# Patient Record
Sex: Male | Born: 1970 | Race: White | Hispanic: No | Marital: Single | Smoking: Never smoker
Health system: Southern US, Community
[De-identification: ages and names within clinical notes are randomized; demographics above are authoritative.]

## PROBLEM LIST (undated history)

## (undated) DIAGNOSIS — K629 Disease of anus and rectum, unspecified: Secondary | ICD-10-CM

## (undated) DIAGNOSIS — K589 Irritable bowel syndrome without diarrhea: Secondary | ICD-10-CM

## (undated) DIAGNOSIS — A630 Anogenital (venereal) warts: Secondary | ICD-10-CM

## (undated) DIAGNOSIS — F419 Anxiety disorder, unspecified: Secondary | ICD-10-CM

## (undated) DIAGNOSIS — K219 Gastro-esophageal reflux disease without esophagitis: Secondary | ICD-10-CM

## (undated) HISTORY — PX: ADENOID EXAMINATION UNDER ANESTHESIA: SHX1127

## (undated) HISTORY — PX: CHOLECYSTECTOMY: SHX55

## (undated) HISTORY — DX: Anxiety disorder, unspecified: F41.9

## (undated) HISTORY — DX: Disease of anus and rectum, unspecified: K62.9

## (undated) HISTORY — DX: Anogenital (venereal) warts: A63.0

## (undated) HISTORY — PX: COLONOSCOPY: SHX174

## (undated) HISTORY — DX: Gastro-esophageal reflux disease without esophagitis: K21.9

## (undated) HISTORY — DX: Irritable bowel syndrome, unspecified: K58.9

---

## 2010-07-08 ENCOUNTER — Ambulatory Visit: Payer: Self-pay | Admitting: Gastroenterology

## 2010-08-04 ENCOUNTER — Ambulatory Visit: Payer: Self-pay | Admitting: Surgery

## 2010-12-08 ENCOUNTER — Other Ambulatory Visit: Payer: Self-pay | Admitting: Gastroenterology

## 2011-06-19 ENCOUNTER — Ambulatory Visit: Payer: Self-pay | Admitting: Otolaryngology

## 2011-06-20 LAB — PATHOLOGY REPORT

## 2012-07-18 ENCOUNTER — Ambulatory Visit: Payer: Self-pay | Admitting: Gastroenterology

## 2012-12-02 ENCOUNTER — Ambulatory Visit (INDEPENDENT_AMBULATORY_CARE_PROVIDER_SITE_OTHER): Payer: BC Managed Care – PPO | Admitting: Surgery

## 2012-12-13 ENCOUNTER — Ambulatory Visit (INDEPENDENT_AMBULATORY_CARE_PROVIDER_SITE_OTHER): Payer: BC Managed Care – PPO | Admitting: General Surgery

## 2013-02-04 ENCOUNTER — Telehealth (INDEPENDENT_AMBULATORY_CARE_PROVIDER_SITE_OTHER): Payer: Self-pay

## 2013-02-04 NOTE — Telephone Encounter (Signed)
LM with family member that we need to r/s pt's appt that is scheduled for 02/05/13 with Dr Michaell Cowing b/c it was scheduled in a place that didn't have an appt spot due to our monthly staff meetings that take place. We normally don't start seeing pt's till 9:15. I want to r/s the pt's appt so I asked for the pt to call me b/c I didn't want the pt driving from [facility] to fine this message out. The family member said he would get the pt to call me. I am canceling the appt for 02/05/13 with Dr Michaell Cowing.

## 2013-02-05 ENCOUNTER — Ambulatory Visit (INDEPENDENT_AMBULATORY_CARE_PROVIDER_SITE_OTHER): Payer: BC Managed Care – PPO | Admitting: Surgery

## 2013-02-17 ENCOUNTER — Ambulatory Visit (INDEPENDENT_AMBULATORY_CARE_PROVIDER_SITE_OTHER): Payer: BC Managed Care – PPO | Admitting: Surgery

## 2013-02-19 ENCOUNTER — Ambulatory Visit (INDEPENDENT_AMBULATORY_CARE_PROVIDER_SITE_OTHER): Payer: BC Managed Care – PPO | Admitting: Surgery

## 2013-03-10 ENCOUNTER — Encounter (INDEPENDENT_AMBULATORY_CARE_PROVIDER_SITE_OTHER): Payer: Self-pay

## 2013-03-10 ENCOUNTER — Encounter (INDEPENDENT_AMBULATORY_CARE_PROVIDER_SITE_OTHER): Payer: Self-pay | Admitting: Surgery

## 2013-03-10 ENCOUNTER — Ambulatory Visit (INDEPENDENT_AMBULATORY_CARE_PROVIDER_SITE_OTHER): Payer: BC Managed Care – PPO | Admitting: Surgery

## 2013-03-10 ENCOUNTER — Telehealth (INDEPENDENT_AMBULATORY_CARE_PROVIDER_SITE_OTHER): Payer: Self-pay

## 2013-03-10 ENCOUNTER — Telehealth (INDEPENDENT_AMBULATORY_CARE_PROVIDER_SITE_OTHER): Payer: Self-pay | Admitting: Surgery

## 2013-03-10 VITALS — BP 172/108 | HR 88 | Temp 98.1°F | Resp 16 | Ht 69.0 in | Wt 185.4 lb

## 2013-03-10 DIAGNOSIS — K589 Irritable bowel syndrome without diarrhea: Secondary | ICD-10-CM

## 2013-03-10 DIAGNOSIS — R198 Other specified symptoms and signs involving the digestive system and abdomen: Secondary | ICD-10-CM

## 2013-03-10 DIAGNOSIS — K6289 Other specified diseases of anus and rectum: Secondary | ICD-10-CM

## 2013-03-10 DIAGNOSIS — A630 Anogenital (venereal) warts: Secondary | ICD-10-CM

## 2013-03-10 NOTE — Telephone Encounter (Signed)
03/10/13- I met with pt and went over benefits, and financial responsibility. He will call me back when he is ready to schedule. skm

## 2013-03-10 NOTE — Telephone Encounter (Signed)
LMOM for pt to call me. I want to give him the good news that his HIV test is negative per Dr Michaell Cowing.

## 2013-03-10 NOTE — Progress Notes (Addendum)
Subjective:     Patient ID: Philip [name], male   DOB: [birth year], 42 y.o.   MRN: [id number]  HPI  Philip [name]  [birth year] [id number]  Patient Care Team: Provider Not In System as PCP - General Sharon Seller, MD as Referring Physician (Gastroenterology) Christena Deem, MD as Consulting Physician (Gastroenterology) Dellie Burns, MD as Consulting Physician (General Surgery)  This patient is a 42 y.o.male who presents today for surgical evaluation at the request of Dr. Marva Panda.   Reason for visit: Recurrent perianal masses/warts  Pleasant male.  History of perianal masses.  The liver condyloma/warts.  Had surgical treatment by Dr. Katrinka Blazing in [place].  Has had followup in the office with office treatments/ablations.  As noted recurrences.  Wished to be evaluated.  His wife comes to our group, also he wished to be seen by [place].  He struggles with some irregular bowels and has been told he has irritable bowel syndrome.  It seemed to be controlled on a fiber bowel regimen.  He recalls having a normal colonoscopy a few years ago.  No of surprises.  He has had a decent amount of lab work done.  He has a history of anal sex receptive sex.  No history of IV drug abuse.  He has been married and in a monogamous relationship for over 15 years.  Distant history of unprotected sex.  He feels embarrassed that it persists.  No personal nor family history of GI/colon cancer, inflammatory bowel disease, irritable bowel syndrome, allergy such as Celiac Sprue, dietary/dairy problems, colitis, ulcers nor gastritis.  No recent sick contacts/gastroenteritis.  No travel outside the country.  No changes in diet.    Patient Active Problem List   Diagnosis Date Noted  . 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 Mass of perianal area - posterior midline - scar vs AIN 03/10/2013  . Irritable bowel syndrome     Past Medical History  Diagnosis Date  . Irritable bowel syndrome   . GERD (gastroesophageal  reflux disease)   . Anal lesion   . Anal condyloma     Past Surgical History  Procedure Laterality Date  . Colonoscopy    . Adenoid examination under anesthesia      History   Social History  . Marital Status: Married    Spouse Name: N/A    Number of Children: N/A  . Years of Education: N/A   Occupational History  . Not on file.   Social History Main Topics  . Smoking status: Never Smoker   . Smokeless tobacco: Not on file  . Alcohol Use: No  . Drug Use: No  . Sexual Activity: Not on file   Other Topics Concern  . Not on file   Social History Narrative  . No narrative on file    History reviewed. No pertinent family history.  Current Outpatient Prescriptions  Medication Sig Dispense Refill  . amitriptyline (ELAVIL) 25 MG tablet Take 25 mg by mouth at bedtime.      . hyoscyamine (LEVSIN, ANASPAZ) 0.125 MG tablet Take 0.125 mg by mouth every 4 (four) hours as needed.      Marland Kitchen omeprazole (PRILOSEC) 10 MG capsule Take 10 mg by mouth daily.       No current facility-administered medications for this visit.     No Known Allergies  BP 172/108  Pulse 88  Temp(Src) 98.1 F (36.7 C) (Temporal)  Resp 16  Ht 5\' 9"  (1.753 m)  Wt 185 lb 6.4 oz (84.097 kg)  BMI 27.37 kg/m2  No results found.   Review of Systems  Constitutional: Negative for fever, chills and diaphoresis.  HENT: Negative for ear discharge, facial swelling, mouth sores, nosebleeds, sore throat and trouble swallowing.   Eyes: Negative for photophobia, discharge and visual disturbance.  Respiratory: Negative for choking, chest tightness, shortness of breath and stridor.   Cardiovascular: Negative for chest pain and palpitations.  Gastrointestinal: Positive for diarrhea and constipation. Negative for nausea, vomiting, abdominal pain, blood in stool, abdominal distention, anal bleeding and rectal pain.  Endocrine: Negative for cold intolerance and heat intolerance.  Genitourinary: Negative for dysuria,  urgency, difficulty urinating and testicular pain.  Musculoskeletal: Negative for arthralgias, back pain, gait problem and myalgias.  Skin: Negative for color change, pallor, rash and wound.  Allergic/Immunologic: Negative for environmental allergies and food allergies.  Neurological: Negative for dizziness, speech difficulty, weakness, numbness and headaches.  Hematological: Negative for adenopathy. Does not bruise/bleed easily.  Psychiatric/Behavioral: Negative for hallucinations, confusion and agitation.       Objective:   Physical Exam  Constitutional: He is oriented to person, place, and time. He appears well-developed and well-nourished. No distress.  HENT:  Head: Normocephalic.  Mouth/Throat: Oropharynx is clear and moist. No oropharyngeal exudate.  Eyes: Conjunctivae and EOM are normal. Pupils are equal, round, and reactive to light. No scleral icterus.  Neck: Normal range of motion. Neck supple. No tracheal deviation present.  Cardiovascular: Normal rate, regular rhythm and intact distal pulses.   Pulmonary/Chest: Effort normal and breath sounds normal. No respiratory distress.  Abdominal: Soft. He exhibits no distension. There is no tenderness. Hernia confirmed negative in the right inguinal area and confirmed negative in the left inguinal area.  Genitourinary: Rectal exam shows mass. Rectal exam shows no external hemorrhoid, no internal hemorrhoid and no fissure.     Exam done with assistance of male Medical Assistant in the room.  Perianal skin clean with good hygiene.  No pruritis.  No pilonidal disease.  No fissure.  No abscess/fistula.  No external skin tags / hemorrhoids of significance.  Tolerates digital and anoscopic rectal exam.  Normal sphincter tone. Hemorrhoidal piles WNL.  Condyloma/white scars as noted above   Musculoskeletal: Normal range of motion. He exhibits no tenderness.  Lymphadenopathy:    He has no cervical adenopathy.       Right: No inguinal  adenopathy present.       Left: No inguinal adenopathy present.  Neurological: He is alert and oriented to person, place, and time. No cranial nerve deficit. He exhibits normal muscle tone. Coordination normal.  Skin: Skin is warm and dry. No rash noted. He is not diaphoretic. No erythema. No pallor.  Psychiatric: He has a normal mood and affect. His behavior is normal. Judgment and thought content normal.       Assessment:     Recurrent perianal and anal canal condylomata.     Plan:     Because there is disease up in the anal canal and there are 2 more suspicious lesions in the posterior perianal region near the coccyx, I recommended examination under anesthesia.  I would plan excision of the posterior midline white plaque areas and laser ablation vs. Needle tip cautery ablation of the perianal/anal canal masses.  I would have a low threshold to biopsy any suspicious areas in the operating room:  The anatomy & physiology of the anorectal region was discussed.  The pathophysiology of anorectal warts and differential diagnosis was discussed.  Natural history risks without surgery  was discussed such as further growth and cancer.   I stressed the importance of office follow-up to catch early recurrence & minimize/halt progression of disease.  Interventions such as cauterization by topical agents were discussed.  The patient's symptoms are not adequately controlled by non-operative treatments.  I feel the risks & problems of no surgery outweigh the operative risks; therefore, I recommended surgery to treat the anal warts by removal, ablation and/or cauterization.  Risks such as bleeding, infection, need for further treatment, heart attack, death, and other risks were discussed.   I noted a good likelihood this will help address the problem. Goals of post-operative recovery were discussed as well.  Possibility that this will not correct all symptoms was explained.  Post-operative pain, bleeding,  constipation, and other problems after surgery were discussed.  We will work to minimize complications.   Educational handouts further explaining the pathology, treatment options, and bowel regimen were given as well.  Questions were answered.  The patient expresses understanding & wishes to proceed with surgery.   Check HIV status given history of recurrence.  Obtained [facility] clinic medical records of colonoscopy, labs, surgical office visits, etc

## 2013-03-10 NOTE — Patient Instructions (Signed)
See the Handout(s) we gave you.  Consider surgery to ablate the lesions with a laser and remove some.  Please call our office at [phone] if you wish to schedule surgery or if you have further questions / concerns.   Genital Warts Genital warts are a sexually transmitted infection. They may appear as small bumps on the tissues of the genital area. CAUSES  Genital warts are caused by a virus called human papillomavirus (HPV). HPV is the most common sexually transmitted disease (STD) and infection of the sex organs. This infection is spread by having unprotected sex with an infected person. It can be spread by vaginal, anal, and oral sex. Many people do not know they are infected. They may be infected for years without problems. However, even if they do not have problems, they can unknowingly pass the infection to their sexual partners. SYMPTOMS   Itching and irritation in the genital area.  Warts that bleed.  Painful sexual intercourse. DIAGNOSIS  Warts are usually recognized with the naked eye on the vagina, vulva, perineum, anus, and rectum. Certain tests can also diagnose genital warts, such as:  A Pap test.  A tissue sample (biopsy) exam.  Colposcopy. A magnifying tool is used to examine the vagina and cervix. The HPV cells will change color when certain solutions are used. TREATMENT  Warts can be removed by:  Applying certain chemicals, such as cantharidin or podophyllin.  Liquid nitrogen freezing (cryotherapy).  Immunotherapy with candida or trichophyton injections.  Laser treatment.  Burning with an electrified probe (electrocautery).  Interferon injections.  Surgery. PREVENTION  HPV vaccination can help prevent HPV infections that cause genital warts and that cause cancer of the cervix. It is recommended that the vaccination be given to people between the ages 49 to 26 years old. The vaccine might not work as well or might not work at all if you already have HPV.  It should not be given to pregnant women. HOME CARE INSTRUCTIONS   It is important to follow your caregiver's instructions. The warts will not go away without treatment. Repeat treatments are often needed to get rid of warts. Even after it appears that the warts are gone, the normal tissue underneath often remains infected.  Do not try to treat genital warts with medicine used to treat hand warts. This type of medicine is strong and can burn the skin in the genital area, causing more damage.  Tell your past and current sexual partner(s) that you have genital warts. They may be infected also and need treatment.  Avoid sexual contact while being treated.  Do not touch or scratch the warts. The infection may spread to other parts of your body.  Women with genital warts should have a cervical cancer check (Pap test) at least once a year. This type of cancer is slow-growing and can be cured if found early. Chances of developing cervical cancer are increased with HPV.  Inform your obstetrician about your warts in the event of pregnancy. This virus can be passed to the baby's respiratory tract. Discuss this with your caregiver.  Use a condom during sexual intercourse. Following treatment, the use of condoms will help prevent reinfection.  Ask your caregiver about using over-the-counter anti-itch creams. SEEK MEDICAL CARE IF:   Your treated skin becomes red, swollen, or painful.  You have a fever.  You feel generally ill.  You feel little lumps in and around your genital area.  You are bleeding or have painful sexual intercourse. MAKE  SURE YOU:   Understand these instructions.  Will watch your condition.  Will get help right away if you are not doing well or get worse. Document Released: 03/10/2000 Document Revised: 06/05/2011 Document Reviewed: 09/19/2010 [facility] Patient Information 2014 [place], [place].   ANORECTAL SURGERY: POST OP INSTRUCTIONS  1. Take your usually prescribed  home medications unless otherwise directed. 2. DIET: Follow a light bland diet the first 24 hours after arrival home, such as soup, liquids, crackers, etc.  Be sure to include lots of fluids daily.  Avoid fast food or heavy meals as your are more likely to get nauseated.  Eat a low fat the next few days after surgery.   3. PAIN CONTROL: a. Pain is best controlled by a usual combination of three different methods TOGETHER: i. Ice/Heat ii. Over the counter pain medication iii. Prescription pain medication b. Most patients will experience some swelling and discomfort in the anus/rectal area. and incisions.  Ice packs or heat (30-60 minutes up to 6 times a day) will help. Use ice for the first few days to help decrease swelling and bruising, then switch to heat such as warm towels, sitz baths, warm baths, etc to help relax tight/sore spots and speed recovery.  Some people prefer to use ice alone, heat alone, alternating between ice & heat.  Experiment to what works for you.  Swelling and bruising can take several weeks to resolve.   c. It is helpful to take an over-the-counter pain medication regularly for the first few weeks.  Choose one of the following that works best for you: i. Naproxen (Aleve, etc)  Two 220mg  tabs twice a day ii. Ibuprofen (Advil, etc) Three 200mg  tabs four times a day (every meal & bedtime) iii. Acetaminophen (Tylenol, etc) 500-650mg  four times a day (every meal & bedtime) d. A  prescription for pain medication (such as oxycodone, hydrocodone, etc) should be given to you upon discharge.  Take your pain medication as prescribed.  i. If you are having problems/concerns with the prescription medicine (does not control pain, nausea, vomiting, rash, itching, etc), please call us [phone] to see if we need to switch you to a different pain medicine that will work better for you and/or control your side effect better. ii. If you need a refill on your pain medication, please contact  your pharmacy.  They will contact our office to request authorization. Prescriptions will not be filled after 5 pm or on week-ends.  Use a Sitz Bath 4-8 times a day for relief A sitz bath is a warm water bath taken in the sitting position that covers only the hips and buttocks. It may be used for either healing or hygiene purposes. Sitz baths are also used to relieve pain, itching, or muscle spasms. The water may contain medicine. Moist heat will help you heal and relax.  HOME CARE INSTRUCTIONS  Take 3 to 4 sitz baths a day. 1. Fill the bathtub half full with warm water. 2. Sit in the water and open the drain a little. 3. Turn on the warm water to keep the tub half full. Keep the water running constantly. 4. Soak in the water for 15 to 20 minutes. 5. After the sitz bath, pat the affected area dry first. SEEK MEDICAL CARE IF:  You get worse instead of better. Stop the sitz baths if you get worse.   4. KEEP YOUR BOWELS REGULAR a. The goal is one bowel movement a day b. Avoid getting constipated.  Between  the surgery and the pain medications, it is common to experience some constipation.  Increasing fluid intake and taking a fiber supplement (such as Metamucil, Citrucel, FiberCon, MiraLax, etc) 1-2 times a day regularly will usually help prevent this problem from occurring.  A mild laxative (prune juice, Milk of Magnesia, MiraLax, etc) should be taken according to package directions if there are no bowel movements after 48 hours. c. Watch out for diarrhea.  If you have many loose bowel movements, simplify your diet to bland foods & liquids for a few days.  Stop any stool softeners and decrease your fiber supplement.  Switching to mild anti-diarrheal medications (Kayopectate, Pepto Bismol) can help.  If this worsens or does not improve, please call us.  5. Wound Care a. Remove your bandages the day after surgery.  Unless discharge instructions indicate otherwise, leave your bandage dry and in place  overnight.  Remove the bandage during your first bowel movement.   b. Allow the wound packing to fall out over the next few days.  You can trim exposed gauze / ribbon as it falls out.  You do not need to repack the wound unless instructed otherwise.  Wear an absorbent pad or soft cotton gauze in your underwear as needed to catch any drainage and help keep the area  c. Keep the area clean and dry.  Bathe / shower every day.  Keep the area clean by showering / bathing over the incision / wound.   It is okay to soak an open wound to help wash it.  Wet wipes or showers / gentle washing after bowel movements is often less traumatic than regular toilet paper. d. Bonita Quin may have some styrofoam-like soft packing in the rectum which will come out with the first bowel movement.  e. You will often notice bleeding with bowel movements.  This should slow down by the end of the first week of surgery f. Expect some drainage.  This should slow down, too, by the end of the first week of surgery.  Wear an absorbent pad or soft cotton gauze in your underwear until the drainage stops. 6. ACTIVITIES as tolerated:   a. You may resume regular (light) daily activities beginning the next day-such as daily self-care, walking, climbing stairs-gradually increasing activities as tolerated.  If you can walk 30 minutes without difficulty, it is safe to try more intense activity such as jogging, treadmill, bicycling, low-impact aerobics, swimming, etc. b. Save the most intensive and strenuous activity for last such as sit-ups, heavy lifting, contact sports, etc  Refrain from any heavy lifting or straining until you are off narcotics for pain control.   c. DO NOT PUSH THROUGH PAIN.  Let pain be your guide: If it hurts to do something, don't do it.  Pain is your body warning you to avoid that activity for another week until the pain goes down. d. You may drive when you are no longer taking prescription pain medication, you can comfortably sit  for long periods of time, and you can safely maneuver your car and apply brakes. e. Bonita Quin may have sexual intercourse when it is comfortable.  7. FOLLOW UP in our office a. Please call [facility] at [phone] to set up an appointment to see your surgeon in the office for a follow-up appointment approximately 2 weeks after your surgery. b. Make sure that you call for this appointment the day you arrive home to insure a convenient appointment time. 10. IF YOU HAVE DISABILITY OR FAMILY  LEAVE FORMS, BRING THEM TO THE OFFICE FOR PROCESSING.  DO NOT GIVE THEM TO YOUR DOCTOR.        WHEN TO CALL us [phone]: 1. Poor pain control 2. Reactions / problems with new medications (rash/itching, nausea, etc)  3. Fever over 101.5 F (38.5 C) 4. Inability to urinate 5. Nausea and/or vomiting 6. Worsening swelling or bruising 7. Continued bleeding from incision. 8. Increased pain, redness, or drainage from the incision  The clinic staff is available to answer your questions during regular business hours (8:30am-5pm).  Please don't hesitate to call and ask to speak to one of our nurses for clinical concerns.   A surgeon from [facility] is always on call at the hospitals   If you have a medical emergency, go to the nearest emergency room or call 911.    [facility] [address], [address], [place], [place]  [address] ? MAIN: [phone] ? TOLL FREE: [phone] ? FAX [phone] [email or web address]  GETTING TO GOOD BOWEL HEALTH. Irregular bowel habits such as constipation and diarrhea can lead to many problems over time.  Having one soft bowel movement a day is the most important way to prevent further problems.  The anorectal canal is designed to handle stretching and feces to safely manage our ability to get rid of solid waste (feces, poop, stool) out of our body.  BUT, hard constipated stools can act like ripping concrete bricks and diarrhea  can be a burning fire to this very sensitive area of our body, causing inflamed hemorrhoids, anal fissures, increasing risk is perirectal abscesses, abdominal pain/bloating, an making irritable bowel worse.     The goal: ONE SOFT BOWEL MOVEMENT A DAY!  To have soft, regular bowel movements:    Drink at least 8 tall glasses of water a day.     Take plenty of fiber.  Fiber is the undigested part of plant food that passes into the colon, acting s "natures broom" to encourage bowel motility and movement.  Fiber can absorb and hold large amounts of water. This results in a larger, bulkier stool, which is soft and easier to pass. Work gradually over several weeks up to 6 servings a day of fiber (25g a day even more if needed) in the form of: o Vegetables -- Root (potatoes, carrots, turnips), leafy green (lettuce, salad greens, celery, spinach), or cooked high residue (cabbage, broccoli, etc) o Fruit -- Fresh (unpeeled skin & pulp), Dried (prunes, apricots, cherries, etc ),  or stewed ( applesauce)  o Whole grain breads, pasta, etc (whole wheat)  o Bran cereals    Bulking Agents -- This type of water-retaining fiber generally is easily obtained each day by one of the following:  o Psyllium bran -- The psyllium plant is remarkable because its ground seeds can retain so much water. This product is available as Metamucil, Konsyl, Effersyllium, Per Diem Fiber, or the less expensive generic preparation in drug and health food stores. Although labeled a laxative, it really is not a laxative.  o Methylcellulose -- This is another fiber derived from wood which also retains water. It is available as Citrucel. o Polyethylene Glycol - and "artificial" fiber commonly called Miralax or Glycolax.  It is helpful for people with gassy or bloated feelings with regular fiber o Flax Seed - a less gassy fiber than psyllium   No reading or other relaxing activity while on the toilet. If bowel movements take longer than 5 minutes,  you are too constipated   AVOID CONSTIPATION.  High fiber and water intake usually takes care of this.  Sometimes a laxative is needed to stimulate more frequent bowel movements, but    Laxatives are not a good long-term solution as it can wear the colon out. o Osmotics (Milk of Magnesia, Fleets phosphosoda, Magnesium citrate, MiraLax, GoLytely) are safer than  o Stimulants (Senokot, Castor Oil, Dulcolax, Ex Lax)    o Do not take laxatives for more than 7days in a row.    IF SEVERELY CONSTIPATED, try a Bowel Retraining Program: o Do not use laxatives.  o Eat a diet high in roughage, such as bran cereals and leafy vegetables.  o Drink six (6) ounces of prune or apricot juice each morning.  o Eat two (2) large servings of stewed fruit each day.  o Take one (1) heaping tablespoon of a psyllium-based bulking agent twice a day. Use sugar-free sweetener when possible to avoid excessive calories.  o Eat a normal breakfast.  o Set aside 15 minutes after breakfast to sit on the toilet, but do not strain to have a bowel movement.  o If you do not have a bowel movement by the third day, use an enema and repeat the above steps.    Controlling diarrhea o Switch to liquids and simpler foods for a few days to avoid stressing your intestines further. o Avoid dairy products (especially milk & ice cream) for a short time.  The intestines often can lose the ability to digest lactose when stressed. o Avoid foods that cause gassiness or bloating.  Typical foods include beans and other legumes, cabbage, broccoli, and dairy foods.  Every person has some sensitivity to other foods, so listen to our body and avoid those foods that trigger problems for you. o Adding fiber (Citrucel, Metamucil, psyllium, Miralax) gradually can help thicken stools by absorbing excess fluid and retrain the intestines to act more normally.  Slowly increase the dose over a few weeks.  Too much fiber too soon can backfire and cause cramping &  bloating. o Probiotics (such as active yogurt, Align, etc) may help repopulate the intestines and colon with normal bacteria and calm down a sensitive digestive tract.  Most studies show it to be of mild help, though, and such products can be costly. o Medicines:   Bismuth subsalicylate (ex. Kayopectate, Pepto Bismol) every 30 minutes for up to 6 doses can help control diarrhea.  Avoid if pregnant.   Loperamide (Immodium) can slow down diarrhea.  Start with two tablets (4mg  total) first and then try one tablet every 6 hours.  Avoid if you are having fevers or severe pain.  If you are not better or start feeling worse, stop all medicines and call your doctor for advice o Call your doctor if you are getting worse or not better.  Sometimes further testing (cultures, endoscopy, X-ray studies, bloodwork, etc) may be needed to help diagnose and treat the cause of the diarrhea. o

## 2013-03-11 NOTE — Telephone Encounter (Signed)
Pts wife returned call and given msg from [place]. She will speak with scheduling re: copay and setting up surgery.

## 2013-03-31 DIAGNOSIS — A630 Anogenital (venereal) warts: Secondary | ICD-10-CM

## 2013-04-01 ENCOUNTER — Other Ambulatory Visit (INDEPENDENT_AMBULATORY_CARE_PROVIDER_SITE_OTHER): Payer: Self-pay | Admitting: *Deleted

## 2013-04-01 MED ORDER — OXYCODONE HCL 5 MG PO TABS: 5.0000 mg | ORAL_TABLET | ORAL | Status: DC | PRN

## 2013-04-11 ENCOUNTER — Telehealth (INDEPENDENT_AMBULATORY_CARE_PROVIDER_SITE_OTHER): Payer: Self-pay

## 2013-04-11 NOTE — Telephone Encounter (Signed)
LMOM giving pt f/u appt with DR Gross for 1/30 arrive at 10:15/10:30.

## 2013-04-25 ENCOUNTER — Encounter (INDEPENDENT_AMBULATORY_CARE_PROVIDER_SITE_OTHER): Payer: BC Managed Care – PPO | Admitting: Surgery

## 2013-05-09 ENCOUNTER — Ambulatory Visit (INDEPENDENT_AMBULATORY_CARE_PROVIDER_SITE_OTHER): Payer: BC Managed Care – PPO | Admitting: Surgery

## 2013-05-09 ENCOUNTER — Encounter (INDEPENDENT_AMBULATORY_CARE_PROVIDER_SITE_OTHER): Payer: Self-pay | Admitting: Surgery

## 2013-05-09 VITALS — BP 136/80 | HR 88 | Temp 98.5°F | Resp 18 | Ht 69.0 in | Wt 184.4 lb

## 2013-05-09 DIAGNOSIS — R198 Other specified symptoms and signs involving the digestive system and abdomen: Secondary | ICD-10-CM

## 2013-05-09 DIAGNOSIS — K6289 Other specified diseases of anus and rectum: Secondary | ICD-10-CM

## 2013-05-09 DIAGNOSIS — A630 Anogenital (venereal) warts: Secondary | ICD-10-CM

## 2013-05-09 NOTE — Patient Instructions (Signed)
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 They may appear as small bumps on the tissues of the genital area. CAUSES  Genital warts are caused by a virus called human papillomavirus (HPV). HPV is the most common sexually transmitted disease (STD) and infection of the sex organs. This infection is spread by having unprotected sex with an infected person. It can be spread by vaginal, anal, and oral sex. Many people do not know they are infected. They may be infected for years without problems. However, even if they do not have problems, they can unknowingly pass the infection to their sexual partners. SYMPTOMS   Itching and irritation in the genital area.  Warts that bleed.  Painful sexual intercourse. DIAGNOSIS  Warts are usually recognized with the naked eye on the vagina, vulva, perineum, anus, and rectum. Certain tests can also diagnose genital warts, such as:  A Pap test.  A tissue sample (biopsy) exam.  Colposcopy. A magnifying tool is used to examine the vagina and cervix. The HPV cells will change color when certain solutions are used. TREATMENT  Warts can be removed by:  Applying certain chemicals, such as cantharidin or podophyllin.  Liquid nitrogen freezing (cryotherapy).  Immunotherapy with candida or trichophyton injections.  Laser treatment.  Burning with an electrified probe (electrocautery).  Interferon injections.  Surgery. PREVENTION  HPV vaccination can help prevent HPV infections that cause genital warts and that cause cancer of the cervix. It is recommended that the vaccination be given to people between the ages 68 to 49 years old. The vaccine might not work as well or might not work at all if you already have HPV. It should not be given to  pregnant women. HOME CARE INSTRUCTIONS   It is important to follow your caregiver's instructions. The warts will not go away without treatment. Repeat treatments are often needed to get rid of warts. Even after it appears that the warts are gone, the normal tissue underneath often remains infected.  Do not try to treat genital warts with medicine used to treat hand warts. This type of medicine is strong and can burn the skin in the genital area, causing more damage.  Tell your past and current sexual partner(s) that you have genital warts. They may be infected also and need treatment.  Avoid sexual contact while being treated.  Do not touch or scratch the warts. The infection may spread to other parts of your body.  Women with genital warts should have a cervical cancer check (Pap test) at least once a year. This type of cancer is slow-growing and can be cured if found early. Chances of developing cervical cancer are increased with HPV.  Inform your obstetrician about your warts in the event of pregnancy. This virus can be passed to the baby's respiratory tract. Discuss this with your caregiver.  Use a condom during sexual intercourse. Following treatment, the use of condoms will help prevent reinfection.  Ask your caregiver about using over-the-counter anti-itch creams. SEEK MEDICAL CARE IF:   Your treated skin becomes red, swollen, or painful.  You have a fever.  You feel generally ill.  You feel little lumps in and around your genital area.  You are bleeding or have painful sexual intercourse. MAKE SURE YOU:   Understand these instructions.  Will watch your condition.  Will get help right away if you are not doing well or get worse. Document Released: 03/10/2000 Document Revised: 06/05/2011 Document Reviewed: 09/19/2010 [facility] Patient Information 2014 [employer], [place].   Anal Intraepithelial Neoplasia (AIN) ANAL INTRAEPITHELIAL NEOPLASIA (AIN) Anal Intra-epithelial  Neoplasia (AIN) is a pre-cancerous condition of the skin surrounding the anus. In the early stages of AIN there are abnormal skin cells (epithelial cells) in the outer third of the skin (epithelium). This is called AIN1 (also called a low-grade anal squamous intra-epithelial lesion (LSIL). This can progress to AIN2, where the outer 2/3 of the skin contains abnormal cells, which in turn can result in AIN3 where there are abnormal cells involving the full thickness of skin (AIN2 and AIN3 are also called high-grade anal squamous intra-epithelial lesions (HSIL). The next step is for these abnormal cells to cross a barrier at the base of the skin called the basement membrane. Once this occurs, it is invasive cancer (carcinoma).  RISK FACTORS The risk factors for AIN are the same as for anal cancer, and include the immunosuppressed (eg HIV and transplant patients), and those with previous anal warts. PREVENTION The vaccine currently offered to women to reduce the risk of cervical cancer targets the Human Papilloma Virus (HPV) serotypes 16, 18, 6 and 11. Although not approved for this purpose, this vaccine may have a role for reducing the risk of AIN and anal cancer in high risk populations who do not yet have HPV infection. TREATMENT OF AIN Treatment is aimed at eradicating AIN and preventing anal cancer with minimal disturbance to anal function. There is currently no uniform treatment largely due to the uncertain natural history of AIN, the variable extent of disease, and the fact there is not a universally effective treatment modality.  TOPICAL AGENTS Imiquimod cream 5% (Aldara ) can be applied to the area 3 times a day, and can be used for up to 16 weeks. It is an immunomodulatory agent and that attacks the virus responsible for warts, but also has anti-tumour effects [1]. There is some evidence that it not only slows the progression of AIN, but causes regression, with one study showing that 3/4 of men with  AIN had their AIN completely cleared at the end of treatment[2]. It also has the added benefit in those with warts of causing regression of these, and in some cases eradication of the HPV virus. The main disadvantage is relapse in a quarter of cases after cessation of therapy[1]. Side effects of imiquimod include erythema, "flu?like" illness and erosions, which are usually mild. It should not be applied prior to sexual intercourse. A systematic review showed a noncompliance rate of less than 5% because of side effects, mainly related to incompatibility with sexual life [3-4]. Topical 5-Fluorouracil 5% cream (Efudix) can be applied to the area 1-2 times a day, and the largest study showing a recurrence of high grade AIN in only 8% of patients when used for 9-12 weeks[5]. PHOTODYNAMIC THERAPY Photodynamic therapy (PDT) involves the application of a cream (topical sensitizer) such as 5-Flourouracil (Efudix) and subsequent exposure of the anal region to light and oxygen to generate oxygen intermediates that damage areas with AIN [6]. Although there is little evidence, there is a suggestion that early AIN responds to this treatment [7-8]. INFRARED PHOTOCOAGULATION Infrared photocoagulation is the same as photodynamic therapy except that it only uses light with a wavelength longer than visible light. It's use for AIN was first described by Gwenlyn FudgeGoldstein and colleagues [9], who showed that up to 2/3  of AIN can be cured and is effective in preventing progression to cancer.  SURGERY The treatment of widespread AIN3 is controversial. Most advocate surgery, however even with surgery there is a one third risk of recurrence [10-11]. If surgery is performed, it consists of either local excision or extensive excision with split skin grafting. The high recurrence rate with these procedures is thought to be related to ongoing HPV, multi?focal lesions that are not all treated, and a generalised field change. [place] of the high  recurrence rate and extensive surgery required for widespread AIN3, many advocate just close surveillance with regular (3-6 monthly) biopsies, so that early invasive anal cancer can be picked up early and treated accordingly. RADIOTHERAPY Radiotherapy has no role for AIN 3. Its only role is for confirmed anal cancer. FOLLOW-UP Follow?up should involve anoscopic examination, with or without the aid of high?resolution anoscopy. AIN 1 should be reviewed every 6?12 months with discharge from follow up upon resolution of AIN. AIN 3, especially in HIV?positive patients should be followed more closely every 3?6 months with or without treatment. The follow?up of AIN 2 is less clear, as the natural history of this condition is so uncertain. A regime somewhere between that of AIN 1 and 3 is advised, with HIV?positive or immunosuppressed patients being followed more like patients with AIN 3.

## 2013-05-09 NOTE — Progress Notes (Signed)
Subjective:     Patient ID: Philip [name], male   DOB: [birth year], 43 y.o.   MRN: [id number]  HPI  Note: This dictation was prepared with Dragon/digital dictation along with [facility] technology. Any transcriptional errors that result from this process are unintentional.       KAELOB PERSKY  1970-11-29 [id number]  Patient Care Team: Provider Not In System as PCP - General Christena Deem, MD as Consulting Physician (Gastroenterology) Dellie Burns, MD as Consulting Physician (General Surgery)  Procedure (Date: 03/31/2013):  Laser ablation of perianal, anal canal, intergluteal condylomata and scar.  This patient returns for surgical re-evaluation.  He feels much better.  No pain.  No bleeding.  No fevers chills or sweats.  No new lumps or lesions.  In good spirits.  Patient Active Problem List   Diagnosis Date Noted  . 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 Irritable bowel syndrome     Past Medical History  Diagnosis Date  . Irritable bowel syndrome   . GERD (gastroesophageal reflux disease)   . Anal lesion   . Anal condyloma     Past Surgical History  Procedure Laterality Date  . Colonoscopy    . Adenoid examination under anesthesia      History   Social History  . Marital Status: Married    Spouse Name: N/A    Number of Children: N/A  . Years of Education: N/A   Occupational History  . Not on file.   Social History Main Topics  . Smoking status: Never Smoker   . Smokeless tobacco: Not on file  . Alcohol Use: No  . Drug Use: No  . Sexual Activity: Not on file   Other Topics Concern  . Not on file   Social History Narrative  . No narrative on file    No family history on file.  Current Outpatient Prescriptions  Medication Sig Dispense Refill  . amitriptyline (ELAVIL) 25 MG tablet Take 25 mg by mouth at bedtime.      . hyoscyamine (LEVSIN, ANASPAZ) 0.125 MG tablet  Take 0.125 mg by mouth every 4 (four) hours as needed.      Marland Kitchen omeprazole (PRILOSEC) 10 MG capsule Take 10 mg by mouth daily.       No current facility-administered medications for this visit.     No Known Allergies  BP 136/80  Pulse 88  Temp(Src) 98.5 F (36.9 C) (Oral)  Resp 18  Ht 5\' 9"  (1.753 m)  Wt 184 lb 6.4 oz (83.643 kg)  BMI 27.22 kg/m2  No results found.  Review of Systems  Constitutional: Negative for fever, chills and diaphoresis.  HENT: Negative for sore throat and trouble swallowing.   Eyes: Negative for photophobia and visual disturbance.  Respiratory: Negative for choking and shortness of breath.   Cardiovascular: Negative for chest pain and palpitations.  Gastrointestinal: Negative for nausea, vomiting, abdominal distention, anal bleeding and rectal pain.  Genitourinary: Negative for dysuria, urgency, difficulty urinating and testicular pain.  Musculoskeletal: Negative for arthralgias, gait problem, myalgias and neck pain.  Skin: Negative for color change and rash.  Neurological: Negative for dizziness, speech difficulty, weakness and numbness.  Hematological: Negative for adenopathy.  Psychiatric/Behavioral: Negative for hallucinations, confusion and agitation.       Objective:   Physical Exam  Constitutional: He is oriented to person, place, and time. He appears well-developed and well-nourished. No distress.  HENT:  Head: Normocephalic.  Mouth/Throat: Oropharynx is clear and moist. No oropharyngeal exudate.  Eyes: Conjunctivae and EOM are normal. Pupils are equal, round, and reactive to light. No scleral icterus.  Neck: Normal range of motion. No tracheal deviation present.  Cardiovascular: Normal rate, normal heart sounds and intact distal pulses.   Pulmonary/Chest: Effort normal. No respiratory distress.  Abdominal: Soft. He exhibits no distension. There is no tenderness. Hernia confirmed negative in the right inguinal area and confirmed negative in  the left inguinal area.  Incisions clean with normal healing ridges.  No hernias  Genitourinary:     Exam done with assistance of male Medical Assistant in the room.  Perianal skin clean with good hygiene.  No pruritis.  No pilonidal disease.  No fissure.  No abscess/fistula.  Small L lateral perianal scar - not c/w wart  External hemorrhoids none.  Normal sphincter tone.   Musculoskeletal: Normal range of motion. He exhibits no tenderness.  Neurological: He is alert and oriented to person, place, and time. No cranial nerve deficit. He exhibits normal muscle tone. Coordination normal.  Skin: Skin is warm and dry. No rash noted. He is not diaphoretic.  Psychiatric: He has a normal mood and affect. His behavior is normal.       Assessment:     Improved status post ablation of scar and condylomata perianally.     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  Start in 3 months: -every 6 months until negative exam x2, then -every Year until negative exam x2, then -as needed thereafter  Increase activity as tolerated to regular activity.  Low impact exercise such as walking an hour a day at least ideal.  Do not push through pain.  Diet as tolerated.  Low fat high fiber diet ideal.  Bowel regimen with 30 g fiber a day and fiber supplement as needed to avoid problems.  Return to clinic as needed.   Instructions discussed.  Followup with primary care physician for other health issues as would normally be done.  Consider screening for malignancies (breast, prostate, colon, melanoma, etc) as appropriate.  Questions answered.  The patient expressed understanding and appreciation

## 2013-08-11 ENCOUNTER — Ambulatory Visit (INDEPENDENT_AMBULATORY_CARE_PROVIDER_SITE_OTHER): Payer: BC Managed Care – PPO | Admitting: Surgery

## 2013-08-26 ENCOUNTER — Encounter (INDEPENDENT_AMBULATORY_CARE_PROVIDER_SITE_OTHER): Payer: Self-pay | Admitting: Surgery

## 2013-08-26 ENCOUNTER — Ambulatory Visit (INDEPENDENT_AMBULATORY_CARE_PROVIDER_SITE_OTHER): Payer: BC Managed Care – PPO | Admitting: Surgery

## 2013-08-26 VITALS — BP 126/76 | HR 62 | Temp 97.6°F | Ht 74.0 in | Wt 180.0 lb

## 2013-08-26 DIAGNOSIS — K6289 Other specified diseases of anus and rectum: Secondary | ICD-10-CM

## 2013-08-26 DIAGNOSIS — R198 Other specified symptoms and signs involving the digestive system and abdomen: Secondary | ICD-10-CM

## 2013-08-26 DIAGNOSIS — A630 Anogenital (venereal) warts: Secondary | ICD-10-CM

## 2013-08-26 NOTE — Progress Notes (Signed)
Subjective:     Patient ID: Philip [name], male   DOB: [birth year], 43 y.o.   MRN: [id number]  HPI   Note: This dictation was prepared with Dragon/digital dictation along with [facility] technology. Any transcriptional errors that result from this process are unintentional.       TIMMOTHY NONNEMACHER  Oct 27, 1970 [id number]  Patient Care Team: Provider Not In System as PCP - General Christena Deem, MD as Consulting Physician (Gastroenterology) Dellie Burns, MD as Consulting Physician (General Surgery)  Procedure (Date: 03/31/2013):  Laser ablation of perianal, anal canal, intergluteal condylomata and scar.  This patient returns for surgical re-evaluation.  He feels much better.  No pain.  No bleeding.  No fevers chills or sweats.  No new lumps or lesions.  In good spirits.  Patient Active Problem List   Diagnosis Date Noted  . 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 Irritable bowel syndrome     Past Medical History  Diagnosis Date  . Irritable bowel syndrome   . GERD (gastroesophageal reflux disease)   . Anal lesion   . Anal condyloma     Past Surgical History  Procedure Laterality Date  . Colonoscopy    . Adenoid examination under anesthesia      History   Social History  . Marital Status: Married    Spouse Name: N/A    Number of Children: N/A  . Years of Education: N/A   Occupational History  . Not on file.   Social History Main Topics  . Smoking status: Never Smoker   . Smokeless tobacco: Not on file  . Alcohol Use: No  . Drug Use: No  . Sexual Activity: Not on file   Other Topics Concern  . Not on file   Social History Narrative  . No narrative on file    History reviewed. No pertinent family history.  Current Outpatient Prescriptions  Medication Sig Dispense Refill  . amitriptyline (ELAVIL) 25 MG tablet Take 25 mg by mouth at bedtime.      . hyoscyamine (LEVSIN,  ANASPAZ) 0.125 MG tablet Take 0.125 mg by mouth every 4 (four) hours as needed.      Marland Kitchen omeprazole (PRILOSEC) 10 MG capsule Take 10 mg by mouth daily.       No current facility-administered medications for this visit.     No Known Allergies  BP 126/76  Pulse 62  Temp(Src) 97.6 F (36.4 C)  Ht 6\' 2"  (1.88 m)  Wt 180 lb (81.647 kg)  BMI 23.10 kg/m2  No results found.  Review of Systems  Constitutional: Negative for fever, chills and diaphoresis.  HENT: Negative for sore throat and trouble swallowing.   Eyes: Negative for photophobia and visual disturbance.  Respiratory: Negative for choking and shortness of breath.   Cardiovascular: Negative for chest pain and palpitations.  Gastrointestinal: Negative for nausea, vomiting, abdominal distention, anal bleeding and rectal pain.  Genitourinary: Negative for dysuria, urgency, difficulty urinating and testicular pain.  Musculoskeletal: Negative for arthralgias, gait problem, myalgias and neck pain.  Skin: Negative for color change and rash.  Neurological: Negative for dizziness, speech difficulty, weakness and numbness.  Hematological: Negative for adenopathy.  Psychiatric/Behavioral: Negative for hallucinations, confusion and agitation.       Objective:   Physical Exam  Constitutional: He is oriented to person, place, and time. He appears well-developed and well-nourished. No distress.  HENT:  Head: Normocephalic.  Mouth/Throat: Oropharynx is clear and moist. No oropharyngeal exudate.  Eyes: Conjunctivae and EOM are normal. Pupils are equal, round, and reactive to light. No scleral icterus.  Neck: Normal range of motion. No tracheal deviation present.  Cardiovascular: Normal rate, normal heart sounds and intact distal pulses.   Pulmonary/Chest: Effort normal. No respiratory distress.  Abdominal: Soft. He exhibits no distension. There is no tenderness. Hernia confirmed negative in the right inguinal area and confirmed negative in  the left inguinal area.  Incisions clean with normal healing ridges.  No hernias  Genitourinary:     Exam done with assistance of male Medical Assistant in the room.  Perianal skin clean with good hygiene.  No pruritis.  No pilonidal disease.  No fissure.  No abscess/fistula.  Small L lateral perianal scar - not c/w wart  External hemorrhoids none.  Normal sphincter tone.   Musculoskeletal: Normal range of motion. He exhibits no tenderness.  Neurological: He is alert and oriented to person, place, and time. No cranial nerve deficit. He exhibits normal muscle tone. Coordination normal.  Skin: Skin is warm and dry. No rash noted. He is not diaphoretic.  Psychiatric: He has a normal mood and affect. His behavior is normal.       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     Plan:     I went ahead and ablated these 2 small areas with podophyllin:  The pathophysiology of condyloma acuminata / anal warts was discussed.  The recognition of this is a sexually transmitted disease and can be transmitted to partners especially in an unprotected fashion was discussed as well. Options of fulguration by topical podophyllin in the office, OR examination with laser ablation versus cautery fulguration +/- excision was discussed as well. Importance for regular followup to follow the area & catch recurrence of condyloma was discussed as well.  Given the fact that there was not a large volume nor large size, I offered topical treatment in the office first. I then would recommend follow up to make sure they have resolved or require more aggressive treatment. If worse or not improved, then consideration of removal by laser ablation versus cauterization and/or excision was discussed. Technique risks benefits alternatives were discussed. The patient agreed to topical therapy.  The patient positioned in the decubitus position. I used podophyllin and painted the warts carefully,  trying to avoid applying to normal perianal tissue.  The patient tolerated the procedure well.  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  Start in 3 months: -every 6 months until negative exam x2, then -every Year until negative exam x2, then -as needed thereafter  Increase activity as tolerated to regular activity.  Low impact exercise such as walking an hour a day at least ideal.  Do not push through pain.  Diet as tolerated.  Low fat high fiber diet ideal.  Bowel regimen with 30 g fiber a day and fiber supplement as needed to avoid problems.  Instructions discussed.  Followup with primary care physician for other health issues as would normally be done.  Consider screening for malignancies (breast, prostate, colon, melanoma, etc) as appropriate.  Questions answered.  The patient expressed understanding and appreciation

## 2013-08-26 NOTE — Patient Instructions (Signed)
Recommended surgeon followup physical exam schedule for condyloma/warts: -every 6 months until negative exam x2, then -every Year until negative exam x2, then -as needed thereafter   Genital Warts Genital warts are a sexually transmitted infection. They may appear as small bumps on the tissues of the genital area. CAUSES  Genital warts are caused by a virus called human papillomavirus (HPV). HPV is the most common sexually transmitted disease (STD) and infection of the sex organs. This infection is spread by having unprotected sex with an infected person. It can be spread by vaginal, anal, and oral sex. Many people do not know they are infected. They may be infected for years without problems. However, even if they do not have problems, they can unknowingly pass the infection to their sexual partners. SYMPTOMS   Itching and irritation in the genital area.Anal wart   Warts that bleed.  Painful sexual intercourse. DIAGNOSIS  Warts are usually recognized with the naked eye on the vagina, vulva, perineum, anus, and rectum. Certain tests can also diagnose genital warts, such as:  A Pap test.  A tissue sample (biopsy) exam.  Colposcopy. A magnifying tool is used to examine the vagina and cervix. The HPV cells will change color when certain solutions are used. TREATMENT  Warts can be removed by:  Applying certain chemicals, such as cantharidin or podophyllin.  Liquid nitrogen freezing (cryotherapy).  Immunotherapy with candida or trichophyton injections.  Laser treatment.  Burning with an electrified probe (electrocautery).  Interferon injections.  Surgery. PREVENTION  HPV vaccination can help prevent HPV infections that cause genital warts and that cause cancer of the cervix. It is recommended that the vaccination be given to people between the ages 9 to 26 years old. The vaccine might not work as well or might not work at all if you already have HPV. It should not be given to  pregnant women. HOME CARE INSTRUCTIONS   It is important to follow your caregiver's instructions. The warts will not go away without treatment. Repeat treatments are often needed to get rid of warts. Even after it appears that the warts are gone, the normal tissue underneath often remains infected.  Do not try to treat genital warts with medicine used to treat hand warts. This type of medicine is strong and can burn the skin in the genital area, causing more damage.  Tell your past and current sexual partner(s) that you have genital warts. They may be infected also and need treatment.  Avoid sexual contact while being treated.  Do not touch or scratch the warts. The infection may spread to other parts of your body.  Women with genital warts should have a cervical cancer check (Pap test) at least once a year. This type of cancer is slow-growing and can be cured if found early. Chances of developing cervical cancer are increased with HPV.  Inform your obstetrician about your warts in the event of pregnancy. This virus can be passed to the baby's respiratory tract. Discuss this with your caregiver.  Use a condom during sexual intercourse. Following treatment, the use of condoms will help prevent reinfection.  Ask your caregiver about using over-the-counter anti-itch creams. SEEK MEDICAL CARE IF:   Your treated skin becomes red, swollen, or painful.  You have a fever.  You feel generally ill.  You feel little lumps in and around your genital area.  You are bleeding or have painful sexual intercourse. MAKE SURE YOU:   Understand these instructions.  Will watch your condition.    Will get help right away if you are not doing well or get worse.   

## 2013-09-05 ENCOUNTER — Ambulatory Visit: Payer: Self-pay | Admitting: Gastroenterology

## 2013-09-11 LAB — PATHOLOGY REPORT

## 2013-09-23 ENCOUNTER — Ambulatory Visit: Payer: Self-pay | Admitting: Surgery

## 2013-09-26 LAB — PATHOLOGY REPORT

## 2014-02-03 ENCOUNTER — Emergency Department: Payer: Self-pay | Admitting: Emergency Medicine

## 2014-02-03 LAB — BASIC METABOLIC PANEL
Anion Gap: 10 (ref 7–16)
BUN: 11 mg/dL (ref 7–18)
CO2: 24 mmol/L (ref 21–32)
Calcium, Total: 8.9 mg/dL (ref 8.5–10.1)
Chloride: 105 mmol/L (ref 98–107)
Creatinine: 1.02 mg/dL (ref 0.60–1.30)
EGFR (African American): >60
Glucose: 103 mg/dL — ABNORMAL HIGH (ref 65–99)
Osmolality: 277 (ref 275–301)
POTASSIUM: 3.8 mmol/L (ref 3.5–5.1)
SODIUM: 139 mmol/L (ref 136–145)

## 2014-02-03 LAB — CBC
HCT: 47.5 % (ref 40.0–52.0)
HGB: 16.8 g/dL (ref 13.0–18.0)
MCH: 34 pg (ref 26.0–34.0)
MCHC: 35.4 g/dL (ref 32.0–36.0)
MCV: 96 fL (ref 80–100)
Platelet: 170 10*3/uL (ref 150–440)
RBC: 4.95 10*6/uL (ref 4.40–5.90)
RDW: 13.6 % (ref 11.5–14.5)
WBC: 8.6 10*3/uL (ref 3.8–10.6)

## 2014-02-03 LAB — TROPONIN I: Troponin-I: <0.02 ng/mL

## 2014-02-13 ENCOUNTER — Ambulatory Visit: Payer: Self-pay | Admitting: Nurse Practitioner

## 2014-02-17 ENCOUNTER — Ambulatory Visit (INDEPENDENT_AMBULATORY_CARE_PROVIDER_SITE_OTHER): Payer: BC Managed Care – PPO | Admitting: Nurse Practitioner

## 2014-02-17 ENCOUNTER — Encounter: Payer: Self-pay | Admitting: Nurse Practitioner

## 2014-02-17 VITALS — BP 128/78 | HR 81 | Temp 97.9°F | Resp 16 | Ht 69.0 in | Wt 177.8 lb

## 2014-02-17 DIAGNOSIS — Z7689 Persons encountering health services in other specified circumstances: Secondary | ICD-10-CM

## 2014-02-17 DIAGNOSIS — Z7189 Other specified counseling: Secondary | ICD-10-CM

## 2014-02-17 DIAGNOSIS — F419 Anxiety disorder, unspecified: Secondary | ICD-10-CM

## 2014-02-17 DIAGNOSIS — K589 Irritable bowel syndrome without diarrhea: Secondary | ICD-10-CM

## 2014-02-17 DIAGNOSIS — F4323 Adjustment disorder with mixed anxiety and depressed mood: Secondary | ICD-10-CM

## 2014-02-17 DIAGNOSIS — F5105 Insomnia due to other mental disorder: Secondary | ICD-10-CM

## 2014-02-17 DIAGNOSIS — F064 Anxiety disorder due to known physiological condition: Secondary | ICD-10-CM

## 2014-02-17 MED ORDER — LORAZEPAM 0.5 MG PO TABS: ORAL_TABLET | ORAL | Status: DC

## 2014-02-17 NOTE — Progress Notes (Signed)
Subjective:    Patient ID: Philip [name], male    DOB: 02-26-1971, 43 y.o.   MRN: [id number]  HPI  Patient Active Problem List   Diagnosis Date Noted  . 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 Irritable bowel syndrome     Subjective:  CC:   Chief Complaint  Patient presents with  . Establish Care    Anxiety issues  . Hypertension    [facility] for [facility] and increased BP    HPI:   Philip [name] is a 43 y.o. male who presents as a new patient to establish primary care with the chief complaint of Anxiety x 1 month.   1) Anxiety- Lost 20 lbs after the separation. [facility] and then was escorted to hospital because he was unable to sleep and eat. He denied suicidal ideations then and at today's visit. The medications were prescribed and he was sent home with his mother. He is here to est. Care and discuss medications for anxiety. He states he has an excellent support group and is getting better slowly.  Treatment to date: Bupriopion HCL XL 150 mg. Lorazepam 0.5 mg and Trazadone 50 mg. Feels like he "doesn't care" and he states this is better for him right now instead of focusing on the separation. He feels he is better equipped to deal with the separation right now.  Feels like Wellbutrin 150 XL wears off before the end of the day, he has been on this since 02/03/14.  Takes lorazepam 0.5 mg 1 tablet 1 hour before trazadone at night - yesterday last dose Trazadone is helpful for sleep. Denies side effects. - Last night last dose  2) Health Maintenance- Diet- Cooks at home, takes lunch to work, conscious of healthy foods  Exercise- Has an active job Immunizations- Discussed influenza and declined.  Colonoscopy- for IBS (2013) Endoscopy- See tubes (2013) [facility]  3) GERD- Stopped Protonix- loose stools. Had Prilosec, but was  switched by GI to Protonix. Controlling by watching food- stays away from greasy foods. Stable at this time.   4) IBS- After Cholecystectomy, symptoms resolved.   5) BP controlled at this time.  Past Medical History  Diagnosis Date  . Irritable bowel syndrome   . GERD (gastroesophageal reflux disease)   . Anal lesion   . Anal condyloma   . Anxiety     Past Surgical History  Procedure Laterality Date  . Colonoscopy    . Adenoid examination under anesthesia    . Cholecystectomy      History   Social History  . Marital Status: Married    Spouse Name: N/A    Number of Children: N/A  . Years of Education: N/A   Occupational History  . Not on file.   Social History Main Topics  . Smoking status: Never Smoker   . Smokeless tobacco: Not on file  . Alcohol Use: No  . Drug Use: No  . Sexual Activity: Not on file   Other Topics Concern  . Not on file   Social History Narrative    BP 128/78 mmHg  Pulse 81  Temp(Src) 97.9 F (36.6 C) (Oral)  Resp 16  Ht 5\' 9"  (1.753 m)  Wt 177 lb 12 oz (80.627 kg)  BMI 26.24 kg/m2  SpO2 98%   Updated Medication List Outpatient Encounter Prescriptions  as of 02/17/2014  Medication Sig  . buPROPion (WELLBUTRIN XL) 150 MG 24 hr tablet Take 150 mg by mouth daily.  Marland Kitchen. LORazepam (ATIVAN) 0.5 MG tablet Take 1 tablet by mouth 3 (three) times daily as needed.  . traZODone (DESYREL) 50 MG tablet Take 1 tablet by mouth at bedtime as needed.  Marland Kitchen. amitriptyline (ELAVIL) 25 MG tablet Take 25 mg by mouth at bedtime.  . hyoscyamine (LEVSIN, ANASPAZ) 0.125 MG tablet Take 0.125 mg by mouth every 4 (four) hours as needed.  Marland Kitchen. omeprazole (PRILOSEC) 10 MG capsule Take 10 mg by mouth daily.    Review of Systems  Constitutional: Negative for fever, chills, diaphoresis, fatigue and unexpected weight change.  HENT: Negative for congestion, ear pain, hearing loss, sneezing and sore throat.   Eyes: Negative for visual disturbance.  Respiratory: Negative  for cough, chest tightness, shortness of breath and wheezing.   Cardiovascular: Negative for chest pain, palpitations and leg swelling.  Gastrointestinal: Negative for nausea, vomiting, abdominal pain and diarrhea.  Genitourinary: Negative for dysuria and difficulty urinating.  Musculoskeletal: Negative for myalgias and arthralgias.  Skin: Negative for rash and wound.  Neurological: Negative for dizziness and headaches.  Hematological: Does not bruise/bleed easily.  Psychiatric/Behavioral:       Positive for anxiety/depression       Objective:   Physical Exam General appearance: alert, cooperative and appears older stated age Ears: normal TM's and external ear canals both ears Throat: lips, mucosa, and tongue normal; teeth and gums normal Neck: no adenopathy, no carotid bruit, supple, symmetrical, trachea midline and thyroid not enlarged, symmetric, no tenderness/mass/nodules Back: symmetric, no curvature. ROM normal. No CVA tenderness. Lungs: clear to auscultation bilaterally Heart: regular rate and rhythm, S1, S2 normal, no murmur, click, rub or gallop Abdomen: soft, non-tender; bowel sounds normal; no masses,  no organomegaly Pulses: 2+ and symmetric Skin: Skin color, texture, turgor normal. No rashes or lesions Lymph nodes: Cervical, supraclavicular, and axillary nodes normal. Psych: Mood/Affect, Behavior, Thought Content, and Judgement normal.     Assessment & Plan:  [facility]  Spent face to face 45 minutes with 50% or greater spent counseling

## 2014-02-17 NOTE — Patient Instructions (Addendum)
Please take 2 of the Bupropion (Wellbutrin XL) 150 mg tablets once daily until finished.  This next refill will be upped to 300 mg XL so you will be taking 1 tablet daily.  If you have any thoughts of harming yourself or other please call 911.  If you have increased depression or anxiety please call us.  We will follow up in 1 month.

## 2014-02-17 NOTE — Progress Notes (Signed)
Pre-visit discussion using our clinic review tool. No additional management support is needed unless otherwise documented below in the visit note.  

## 2014-02-17 NOTE — Assessment & Plan Note (Addendum)
Controlled. Trazadone, he reports, is helping with sleep. Will continue to follow in 1 month.

## 2014-02-17 NOTE — Assessment & Plan Note (Signed)
Resolved after Cholecystectomy.

## 2014-02-17 NOTE — Assessment & Plan Note (Signed)
Controlled. Pt states current meds are working for him. Will up the Bupropion HCL XL from 150 mg to 300 mg daily and follow up in one month. Discussed what to do if suicidal ideations occur. He has excellent support system with friends and family and declined counseling at this time.

## 2014-02-18 ENCOUNTER — Ambulatory Visit: Payer: Self-pay | Admitting: Nurse Practitioner

## 2014-02-23 ENCOUNTER — Telehealth: Payer: Self-pay | Admitting: *Deleted

## 2014-02-23 MED ORDER — BUPROPION HCL ER (XL) 150 MG PO TB24: 150.0000 mg | ORAL_TABLET | Freq: Every day | ORAL | Status: DC

## 2014-02-23 NOTE — Telephone Encounter (Signed)
Spoke with patient, increased Buproprion to 300 mg daily, complaining of nausea, did not feel well, states symptoms are controlled on 150 mg. [facility]?

## 2014-02-23 NOTE — Telephone Encounter (Signed)
Yes, that would be fine. Thanks

## 2014-03-06 ENCOUNTER — Other Ambulatory Visit: Payer: Self-pay | Admitting: Nurse Practitioner

## 2014-03-23 ENCOUNTER — Ambulatory Visit (INDEPENDENT_AMBULATORY_CARE_PROVIDER_SITE_OTHER): Payer: BC Managed Care – PPO | Admitting: Nurse Practitioner

## 2014-03-23 ENCOUNTER — Encounter: Payer: Self-pay | Admitting: Nurse Practitioner

## 2014-03-23 VITALS — BP 118/58 | HR 80 | Temp 97.9°F | Resp 12 | Ht 69.0 in | Wt 175.8 lb

## 2014-03-23 DIAGNOSIS — F4323 Adjustment disorder with mixed anxiety and depressed mood: Secondary | ICD-10-CM

## 2014-03-23 NOTE — Patient Instructions (Signed)
We will follow up in 3 months.   Happy New Year!

## 2014-03-23 NOTE — Assessment & Plan Note (Addendum)
Stable on 150 mg of Wellbutrin. Will continue to follow up in 3 months. Discuss backing off meds per pt request at next visit. Sleep stable on Trazadone.

## 2014-03-23 NOTE — Progress Notes (Signed)
Subjective:    Patient ID: Philip RankinsJames T [name], male    DOB: 02/08/1971, 43 y.o.   MRN: [id number]  HPI  Philip [name] is a 43 yo male with a CC of follow up for anxiety.   1) Wellbutrin 300 mg felt shakey, nauseated, headaches. Dropped back to 150 mg daily. Feels so much better and is not caring what ex-wife is doing at this point he reports. Listening to music to help with moving on.   2) Sleeping through the night mostly. Sleeping much better than in past. Is happy with the Trazadone.    Review of Systems  Constitutional: Negative for fever, chills, diaphoresis and fatigue.  Gastrointestinal: Negative for nausea, vomiting and diarrhea.  Skin: Negative for rash.  Neurological: Negative for headaches.  Psychiatric/Behavioral: Negative for suicidal ideas, confusion, sleep disturbance, decreased concentration and agitation. The patient is nervous/anxious. The patient is not hyperactive.        Towards afternoon, but deals with it   Past Medical History  Diagnosis Date  . Irritable bowel syndrome   . GERD (gastroesophageal reflux disease)   . Anal lesion   . Anal condyloma   . Anxiety     History   Social History  . Marital Status: Married    Spouse Name: N/A    Number of Children: N/A  . Years of Education: N/A   Occupational History  . Not on file.   Social History Main Topics  . Smoking status: Never Smoker   . Smokeless tobacco: Not on file  . Alcohol Use: No  . Drug Use: No  . Sexual Activity: Not on file   Other Topics Concern  . 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 Colonoscopy    . Adenoid examination under anesthesia    . Cholecystectomy      No family history on file.  No Known Allergies  Current Outpatient  Prescriptions on File Prior to Visit  Medication Sig Dispense Refill  . buPROPion (WELLBUTRIN XL) 150 MG 24 hr tablet Take 1 tablet (150 mg total) by mouth daily. 30 tablet 2  . LORazepam (ATIVAN) 0.5 MG tablet Take 1 tablet by mouth at night 1 hour before Trazadone as needed for anxiety. 30 tablet 0  . traZODone (DESYREL) 50 MG tablet TAKE 1 TABLET BY MOUTH ONCE NIGHTLY AT BEDTIME 30 tablet 0   No current facility-administered medications on file prior to visit.      Objective:   Physical Exam  Psychiatric: His mood appears not anxious. His affect is not angry, not blunt, not labile and not inappropriate. His speech is not rapid and/or pressured, not delayed, not tangential and not slurred. He is not agitated, not aggressive, not hyperactive, not slowed, not withdrawn, not actively hallucinating and not combative. Thought content is not paranoid and not delusional. Cognition and memory are not impaired. He does not express impulsivity or inappropriate judgment. He does not exhibit a depressed mood. He expresses no homicidal and no suicidal ideation. He expresses no suicidal plans and no homicidal plans. He is communicative. He exhibits normal recent memory and normal remote memory.  Patient is talkative, smiling, making good eye contact.  He is attentive.    BP 118/58 mmHg  Pulse 80  Temp(Src) 97.9 F (36.6 C) (Oral)  Resp 12  Ht 5\' 9"  (1.753 m)  Wt 175 lb 12.8 oz (79.742 kg)  BMI 25.95 kg/m2  SpO2 97%       Assessment & Plan:

## 2014-03-23 NOTE — Progress Notes (Signed)
Pre visit review using our clinic review tool, if applicable. No additional management support is needed unless otherwise documented below in the visit note. 

## 2014-04-02 ENCOUNTER — Other Ambulatory Visit: Payer: Self-pay | Admitting: Nurse Practitioner

## 2014-05-12 ENCOUNTER — Other Ambulatory Visit: Payer: Self-pay | Admitting: Nurse Practitioner

## 2014-05-25 ENCOUNTER — Other Ambulatory Visit: Payer: Self-pay | Admitting: Nurse Practitioner

## 2014-06-22 ENCOUNTER — Encounter: Payer: Self-pay | Admitting: Nurse Practitioner

## 2014-06-22 ENCOUNTER — Ambulatory Visit (INDEPENDENT_AMBULATORY_CARE_PROVIDER_SITE_OTHER): Payer: BLUE CROSS/BLUE SHIELD | Admitting: Nurse Practitioner

## 2014-06-22 ENCOUNTER — Encounter (INDEPENDENT_AMBULATORY_CARE_PROVIDER_SITE_OTHER): Payer: Self-pay

## 2014-06-22 VITALS — BP 138/86 | HR 80 | Temp 97.4°F | Resp 12 | Ht 69.0 in | Wt 178.0 lb

## 2014-06-22 DIAGNOSIS — F4323 Adjustment disorder with mixed anxiety and depressed mood: Secondary | ICD-10-CM

## 2014-06-22 NOTE — Progress Notes (Signed)
   Subjective:    Patient ID: Donney RankinsJames T Gipson, male    DOB: 28-Sep-1970, 44 y.o.   MRN: [id number]  HPI  Mr. Olena LeatherwoodJarrett is a 44 yo male here for a follow up of medications for anxiety/depression.  1) Doing well on Wellbutrin 150 mg  Tries not to take trazodone- only takes when having trouble falling asleep. Still finds it works well.   Review of Systems  Constitutional: Negative for fever, chills, diaphoresis and fatigue.  Musculoskeletal: Positive for myalgias.       Right calf cramping  Psychiatric/Behavioral: Negative for sleep disturbance, decreased concentration and agitation. The patient is not nervous/anxious.       Objective:   Physical Exam  Constitutional: He is oriented to person, place, and time. He appears well-developed and well-nourished. No distress.  BP 138/86 mmHg  Pulse 80  Temp(Src) 97.4 F (36.3 C) (Oral)  Resp 12  Ht 5\' 9"  (1.753 m)  Wt 178 lb (80.74 kg)  BMI 26.27 kg/m2  SpO2 97%   HENT:  Head: Normocephalic and atraumatic.  Right Ear: External ear normal.  Left Ear: External ear normal.  Eyes: Right eye exhibits no discharge. Left eye exhibits no discharge. No scleral icterus.  Neurological: He is alert and oriented to person, place, and time.  Skin: Skin is warm and dry. No rash noted. He is not diaphoretic.  Psychiatric: He has a normal mood and affect. His behavior is normal. Judgment and thought content normal.      Assessment & Plan:

## 2014-06-22 NOTE — Patient Instructions (Signed)
Drink about 8 glasses of water daily (goal).  Stretch calves before bed time.   Stay on the 150 mg of Wellbutrin.   Follow up in 6 months.

## 2014-06-22 NOTE — Assessment & Plan Note (Signed)
Stable on Wellbutrin 150 mg and Trazodone 50 mg tablet at night. He is on trazodone intermittently. Will follow up in 6 months.

## 2014-06-22 NOTE — Progress Notes (Signed)
Pre visit review using our clinic review tool, if applicable. No additional management support is needed unless otherwise documented below in the visit note. 

## 2014-07-18 NOTE — Op Note (Signed)
PATIENT NAME:  Philip [name], Philip [name] MR#:  [id number] DATE OF BIRTH:  May 04, 1970  DATE OF PROCEDURE:  09/23/2013  PREOPERATIVE DIAGNOSIS: Chronic cholecystitis, cholelithiasis.   POSTOPERATIVE DIAGNOSIS: Chronic cholecystitis, cholelithiasis.  PROCEDURE: Laparoscopic cholecystectomy, cholangiogram.   SURGEON: Renda RollsWilton Smith, M.D.   ANESTHESIA: General.   INDICATIONS: This 44 year old male has a history of right upper quadrant pains, ultrasound findings of gallstones, and chronically elevated bilirubin. Surgery was recommended for definitive treatment.   DESCRIPTION OF PROCEDURE: The patient was placed on the operating table in the supine position, under general anesthesia. The abdomen was prepared with clippers and with ChloraPrep and draped in a sterile manner. A short incision was made in the inferior aspect of the umbilicus and carried down to the deep fascia. One bleeding point was cauterized in the subcutaneous tissues. The deep fascia was grasped with laryngeal hook and elevated. A Veress needle was inserted, aspirated, and irrigated with a saline solution.   Next, the peritoneal cavity was inflated with carbon dioxide. The Veress needle was removed. The 10 mm cannula was inserted. The 10 mm 0 degree laparoscope was inserted to view the peritoneal cavity. The liver appeared normal. Another incision was made in the epigastrium, slightly to the right of the midline, to introduce an 11 mm cannula. Two incisions were made in the lateral aspect of the right upper quadrant to introduce two 5 mm cannulas.   The gallbladder was retracted towards the right shoulder. A number of adhesions were taken down with blunt dissection and use of electrocautery and hook. The pouch of Jon BillingsMorrison was retracted inferiorly and laterally. The porta hepatis was noted. The gallbladder neck was mobilized with incision of the visceral peritoneum. The cystic duct was dissected free from surrounding structures. The cystic artery  was dissected free from surrounding structures. A critical view of safety was demonstrated.   An Endo Clip was placed across the cystic duct adjacent to the neck of the gallbladder. An incision was made in the cystic duct to introduce a Reddick catheter. Half-strength Conray-60 dye was injected as the cholangiogram was done with fluoroscopy, demonstrating the biliary tree and prompt flow of dye into the duodenum. No retained stones were seen. The Reddick catheter was removed. The cystic duct was doubly ligated with endoclips and divided. The cystic artery was controlled with double endoclips and divided. The gallbladder was dissected free from the liver with hook and cautery. There was very scant bleeding. Hemostasis was subsequently intact.  The gallbladder was delivered up through the infraumbilical incision, opened and suctioned, removed and sent with several palpable small stones for routine pathology. The right upper quadrant was further inspected. Hemostasis was intact. The cannulas were removed. Carbon dioxide was allowed to escape from the peritoneal cavity. It was noted that, during the course of the procedure, there was some oozing from the epigastric port site out on to the skin. Several small subcutaneous bleeding points were cauterized. All wounds were treated with injection of 0.5% Sensorcaine with epinephrine. Hemostasis was subsequently intact. The wounds were closed with interrupted 5-0 chromic subcuticular suture, benzoin, and Steri-Strips. Dressings were applied with paper tape. The patient tolerated surgery satisfactorily, and was then prepared for transfer to the recovery room    ____________________________ J. Renda RollsWilton Smith, MD [id number] D: 09/23/2013 15:31:40 ET [name]: 09/23/2013 23:38:56 ET JOB#: [id number]  cc: Adella HareJ. Wilton Smith, MD, <Dictator> Adella HareWILTON J SMITH MD ELECTRONICALLY SIGNED 09/24/2013 19:51

## 2014-07-19 NOTE — Op Note (Signed)
PATIENT NAME:  Philip [name], Philip [name] MR#:  [id number] DATE OF BIRTH:  December 07, 1970  DATE OF PROCEDURE:  06/19/2011  PREOPERATIVE DIAGNOSES:  1. Laryngeal lesion.  2. Right chronic serous otitis media.   POSTOPERATIVE DIAGNOSES:  1. Laryngeal lesion.  2. Right chronic serous otitis media.   PROCEDURES PERFORMED: 1. Suspension microlaryngoscopy with micro flap excision of left true vocal fold lesion.  2. Right myringotomy and tympanostomy tube placement.   SURGEON: Kyung Ruddreighton C. Gaylyn Berish. MD   ANESTHESIA: General endotracheal anesthesia.   ESTIMATED BLOOD LOSS: Less than 5 mL.   IV FLUIDS: Please see anesthesia record.   COMPLICATIONS: None.   DRAINS/STENT PLACEMENTS: Right PE tubes.   SPECIMEN: Left posterior true vocal fold lesion.   INDICATIONS FOR PROCEDURE: The patient is a 44 year old male found to have a laryngeal abnormality on upper endoscopy, however, the patient has severe gag reflex and this was unable to be performed again with the patient awake. The patient presented to the operating room for evaluation of his vocal cords as well as biopsy of his left posterior vocal cord lesion. The patient also has a history of eustachian tube dysfunction and chronic serous otitis media resistant to medical management.   OPERATIVE FINDINGS: Pedunculated left posterior true vocal fold lesion which was excised with a micro flap excision. Right inspissated mucus and right Philip and [name] performed.   DESCRIPTION OF PROCEDURE: The patient was identified in holding, benefits and risks of the procedure were discussed, consent was reviewed, and the patient was taken to the operating room and placed in the supine position. General endotracheal anesthesia with a 6.0 MLT endotracheal tube was placed. Operating microscope was brought into the field. An appropriately sized speculum was placed in the patient's right external auditory canal. This revealed a retracted right tympanic membrane with a middle ear effusion.  Myringotomy was placed in the anterior/inferior aspect of the tympanic membrane in a radial fashion. Inspissated mucus was removed using otologic suction and a PE tube was placed through the myringotomy site with alligator forceps and a Pollyann Kennedyosen pick. Ciprodex drops were instilled.   At this time, attention was directed to the patient's microlaryngoscopy and the patient was rotated 90 degrees. A shoulder roll was placed and a Dedo laryngoscope was inserted into the patient's oral cavity and suspended from a Mayo stand. After visualization of the patient's larynx was made, the patient was noted to be fairly anterior. There was a pedunculated lesion of the left posterior vocal cord. Visualization of the opposite contralateral cord was normal. Anterior commissure was normal and there were no additional abnormalities. At this time, the operating microscope was brought onto the field and under high power magnification, a sickle knife was used to make a lateral incision just lateral to the broad base of the pedunculated lesion. Dissection was carried in the superficial layer of the lamina propria using micro elevator and microscissors. Straight cup forceps was used to grasp on the medial aspect of the pedunculated lesion and the remaining attachments were removed using up-biting microscissors. Hemostasis was achieved using topical epinephrine-soaked pledgets and topical 4% lidocaine was sprayed on the patient's true vocal folds. At this time no further abnormalities were identified and the Dedo laryngoscope was released from suspension and removed from the patient's oral cavity and care of the patient was transferred to anesthesia.   ____________________________ Kyung Ruddreighton C. Marillyn Goren, MD ccv:drc D: 06/19/2011 10:41:39 ET [name]: 06/19/2011 11:09:03 ET JOB#: [id number]  cc: Kyung Ruddreighton C. Kortnee Bas, MD, <Dictator> Kyung RuddREIGHTON C Basma Buchner MD  ELECTRONICALLY SIGNED 06/19/2011 11:52

## 2014-08-16 ENCOUNTER — Other Ambulatory Visit: Payer: Self-pay | Admitting: Nurse Practitioner

## 2014-08-18 ENCOUNTER — Other Ambulatory Visit: Payer: Self-pay | Admitting: Nurse Practitioner

## 2014-08-21 ENCOUNTER — Other Ambulatory Visit: Payer: Self-pay | Admitting: Nurse Practitioner

## 2014-11-21 ENCOUNTER — Other Ambulatory Visit: Payer: Self-pay | Admitting: Nurse Practitioner

## 2014-11-23 NOTE — Telephone Encounter (Signed)
Last OV 3.28.16.  Please advise refill

## 2014-12-23 ENCOUNTER — Encounter: Payer: Self-pay | Admitting: Nurse Practitioner

## 2014-12-23 ENCOUNTER — Ambulatory Visit (INDEPENDENT_AMBULATORY_CARE_PROVIDER_SITE_OTHER): Payer: BLUE CROSS/BLUE SHIELD | Admitting: Nurse Practitioner

## 2014-12-23 VITALS — BP 132/80 | HR 83 | Temp 97.9°F | Resp 18 | Ht 69.0 in | Wt 182.4 lb

## 2014-12-23 DIAGNOSIS — F4323 Adjustment disorder with mixed anxiety and depressed mood: Secondary | ICD-10-CM

## 2014-12-23 DIAGNOSIS — F064 Anxiety disorder due to known physiological condition: Secondary | ICD-10-CM

## 2014-12-23 DIAGNOSIS — H579 Unspecified disorder of eye and adnexa: Secondary | ICD-10-CM

## 2014-12-23 DIAGNOSIS — F5105 Insomnia due to other mental disorder: Principal | ICD-10-CM

## 2014-12-23 DIAGNOSIS — F419 Anxiety disorder, unspecified: Secondary | ICD-10-CM

## 2014-12-23 NOTE — Assessment & Plan Note (Signed)
Pt has greatly improved since first coming to see [place]. Wellbutrin has been helpful, not ready to wean off. No longer needed trazodone. Pt is stable. Will follow.

## 2014-12-23 NOTE — Assessment & Plan Note (Signed)
New onset. Just slight irritation and watering. Advised to use Visine-A and follow up with eye Dr. If not helpful

## 2014-12-23 NOTE — Assessment & Plan Note (Signed)
Sleeping has improved. Not using trazodone. No further complaints

## 2014-12-23 NOTE — Progress Notes (Signed)
Pre visit review using our clinic review tool, if applicable. No additional management support is needed unless otherwise documented below in the visit note. 

## 2014-12-23 NOTE — Patient Instructions (Signed)
Please follow up in 2 months for fasting labs and tetanus (Tdap) Vaccine.

## 2014-12-23 NOTE — Progress Notes (Signed)
Patient ID: Philip [name], male    DOB: Aug 16, 1970  Age: 44 y.o. MRN: [id number]  CC: Follow-up   HPI [name] presents for follow up of depression and anxiety.   1) Pt reports doing much better. He is not needing the trazodone and is still taking the Wellbutrin regularly. He feels much improved and is happy with himself in his life currently.   2) Looked up and something fell into eye, irritation, watery for a few days.    History [name] has a past medical history of Irritable bowel syndrome; GERD (gastroesophageal reflux disease); Anal lesion; Anal condyloma; and Anxiety.   He has past surgical history that includes Colonoscopy; Adenoid examination under anesthesia; and Cholecystectomy.   His family history is not on file.He reports that he has never smoked. He does not have any smokeless tobacco history on file. He reports that he does not drink alcohol or use illicit drugs.  Outpatient Prescriptions Prior to Visit  Medication Sig Dispense Refill  . buPROPion (WELLBUTRIN XL) 150 MG 24 hr tablet TAKE 1 TABLET BY MOUTH EVERY DAY 30 tablet 2  . traZODone (DESYREL) 50 MG tablet TAKE 1 TABLET BY MOUTH ONCE NIGHTLY AT BEDTIME 30 tablet 2   No facility-administered medications prior to visit.    ROS Review of Systems  Constitutional: Negative for fever, chills, diaphoresis and fatigue.  Eyes: Positive for pain, discharge and redness. Negative for itching and visual disturbance.       Left eye tender in the afternoons and evenings, fine when wakes up  Respiratory: Negative for chest tightness, shortness of breath and wheezing.   Cardiovascular: Negative for chest pain, palpitations and leg swelling.  Gastrointestinal: Negative for nausea, vomiting and diarrhea.  Endocrine: Negative for polydipsia, polyphagia and polyuria.  Skin: Negative for rash.  Neurological: Negative for dizziness, weakness and numbness.  Psychiatric/Behavioral: The patient is not nervous/anxious.     Objective:  BP 132/80 mmHg  Pulse 83  Temp(Src) 97.9 F (36.6 C)  Resp 18  Ht  (1.753 m)  Wt 182 lb 6.4 oz (82.736 kg)  BMI 26.92 kg/m2  SpO2 97%  Physical Exam  Constitutional: He is oriented to person, place, and time. He appears well-developed and well-nourished. No distress.  HENT:  Head: Normocephalic and atraumatic.  Right Ear: External ear normal.  Left Ear: External ear normal.  Eyes: Conjunctivae and EOM are normal. Pupils are equal, round, and reactive to light. Right eye exhibits no discharge. Left eye exhibits no discharge. No scleral icterus.  No visible foreign objects  Cardiovascular: Normal rate, regular rhythm and normal heart sounds.   Pulmonary/Chest: Effort normal and breath sounds normal. No respiratory distress. He has no wheezes. He has no rales. He exhibits no tenderness.  Neurological: He is alert and oriented to person, place, and time.  Skin: Skin is warm and dry. No rash noted. He is not diaphoretic.  Psychiatric: He has a normal mood and affect. His behavior is normal. Judgment and thought content normal.   Assessment & Plan:   [name] was seen today for follow-up.  Diagnoses and all orders for this visit:  Hyposomnia, insomnia or sleeplessness associated with anxiety  Adjustment reaction with anxiety and depression  Left eye complaint  I have discontinued Mr. Gladish traZODone. I am also having him maintain his buPROPion.  No orders of the defined types were placed in this encounter.     Follow-up: Return in about 2 months (around 02/22/2015) for Labs, follow up,  tdap .

## 2015-02-22 ENCOUNTER — Ambulatory Visit: Payer: BLUE CROSS/BLUE SHIELD | Admitting: Nurse Practitioner

## 2015-03-01 ENCOUNTER — Other Ambulatory Visit: Payer: Self-pay | Admitting: Nurse Practitioner

## 2015-03-08 ENCOUNTER — Encounter: Payer: Self-pay | Admitting: Nurse Practitioner

## 2015-03-08 ENCOUNTER — Ambulatory Visit (INDEPENDENT_AMBULATORY_CARE_PROVIDER_SITE_OTHER): Payer: BLUE CROSS/BLUE SHIELD | Admitting: Nurse Practitioner

## 2015-03-08 VITALS — BP 152/98 | HR 84 | Temp 98.4°F | Resp 12 | Ht 69.0 in | Wt 181.5 lb

## 2015-03-08 DIAGNOSIS — Z23 Encounter for immunization: Secondary | ICD-10-CM

## 2015-03-08 DIAGNOSIS — F4323 Adjustment disorder with mixed anxiety and depressed mood: Secondary | ICD-10-CM

## 2015-03-08 DIAGNOSIS — R030 Elevated blood-pressure reading, without diagnosis of hypertension: Secondary | ICD-10-CM

## 2015-03-08 DIAGNOSIS — H938X9 Other specified disorders of ear, unspecified ear: Secondary | ICD-10-CM | POA: Insufficient documentation

## 2015-03-08 DIAGNOSIS — IMO0001 Reserved for inherently not codable concepts without codable children: Secondary | ICD-10-CM | POA: Insufficient documentation

## 2015-03-08 DIAGNOSIS — H938X1 Other specified disorders of right ear: Secondary | ICD-10-CM | POA: Diagnosis not present

## 2015-03-08 LAB — LIPID PANEL
CHOLESTEROL: 167 mg/dL (ref 0–200)
HDL: 52.7 mg/dL (ref >39.00)
LDL Cholesterol: 88 mg/dL (ref 0–99)
NonHDL: 114.35
Total CHOL/HDL Ratio: 3
Triglycerides: 130 mg/dL (ref 0.0–149.0)
VLDL: 26 mg/dL (ref 0.0–40.0)

## 2015-03-08 LAB — CBC WITH DIFFERENTIAL/PLATELET
Basophils Absolute: 0 10*3/uL (ref 0.0–0.1)
Basophils Relative: 0.7 % (ref 0.0–3.0)
EOS PCT: 2.4 % (ref 0.0–5.0)
Eosinophils Absolute: 0.1 10*3/uL (ref 0.0–0.7)
HCT: 47 % (ref 39.0–52.0)
HEMOGLOBIN: 16.2 g/dL (ref 13.0–17.0)
LYMPHS ABS: 1.2 10*3/uL (ref 0.7–4.0)
Lymphocytes Relative: 19.9 % (ref 12.0–46.0)
MCHC: 34.4 g/dL (ref 30.0–36.0)
MCV: 99.9 fl (ref 78.0–100.0)
MONOS PCT: 8 % (ref 3.0–12.0)
Monocytes Absolute: 0.5 10*3/uL (ref 0.1–1.0)
NEUTROS PCT: 69 % (ref 43.0–77.0)
Neutro Abs: 4.1 10*3/uL (ref 1.4–7.7)
Platelets: 134 10*3/uL — ABNORMAL LOW (ref 150.0–400.0)
RBC: 4.7 Mil/uL (ref 4.22–5.81)
RDW: 13.9 % (ref 11.5–15.5)
WBC: 6 10*3/uL (ref 4.0–10.5)

## 2015-03-08 LAB — COMPREHENSIVE METABOLIC PANEL
ALK PHOS: 50 U/L (ref 39–117)
ALT: 14 U/L (ref 0–53)
AST: 12 U/L (ref 0–37)
Albumin: 4.5 g/dL (ref 3.5–5.2)
BILIRUBIN TOTAL: 2 mg/dL — AB (ref 0.2–1.2)
BUN: 15 mg/dL (ref 6–23)
CO2: 27 mEq/L (ref 19–32)
Calcium: 9.2 mg/dL (ref 8.4–10.5)
Chloride: 105 mEq/L (ref 96–112)
Creatinine, Ser: 1.05 mg/dL (ref 0.40–1.50)
GFR: 80.5 mL/min (ref >60.00)
Glucose, Bld: 100 mg/dL — ABNORMAL HIGH (ref 70–99)
POTASSIUM: 4 meq/L (ref 3.5–5.1)
Sodium: 139 mEq/L (ref 135–145)
TOTAL PROTEIN: 6.5 g/dL (ref 6.0–8.3)

## 2015-03-08 LAB — TSH: TSH: 2.08 u[IU]/mL (ref 0.35–4.50)

## 2015-03-08 LAB — HEMOGLOBIN A1C: Hgb A1c MFr Bld: 4.3 % — ABNORMAL LOW (ref 4.6–6.5)

## 2015-03-08 NOTE — Assessment & Plan Note (Signed)
Due to anxiety surrounding vaccination and lab draws. Patient declined repeat of blood pressure.

## 2015-03-08 NOTE — Progress Notes (Signed)
Pre-visit discussion using our clinic review tool. No additional management support is needed unless otherwise documented below in the visit note.  

## 2015-03-08 NOTE — Assessment & Plan Note (Addendum)
After discussion with patient about options for treatment and diagnosis of behavioral complaints patient has declined referral to psychiatry today. He would like to continue on Wellbutrin 150 mg in the morning. Will obtain fasting labs today, especially checking TSH.  I have spent 25 minutes face-to-face with patient greater than 50% of time spent on counseling on conditions, treatment options, and other complaints

## 2015-03-08 NOTE — Assessment & Plan Note (Signed)
Patient sees Dr. Andee PolesVaught ENT. Since patient has tubes and the tube is blocked with cerumen deferred to ENT for care. Patient agreeable.

## 2015-03-08 NOTE — Patient Instructions (Signed)
Please follow up in 3 months for physical exam.   Please call anytime if you want a referral to psychiatry.   Please visit the lab today before leaving.   You have had your Tdap (tetanus/diptheria/pertusis vaccination) today.   Merry Christmas!

## 2015-03-08 NOTE — Progress Notes (Signed)
Patient ID: Philip [name], male    DOB: [birth year]  Age: 44 y.o. MRN: [id number]  CC: Follow-up   HPI Philip [name] presents for follow-up of anxiety and CC of right ear decreased hearing.   1) patient reports that his mother went to see behavioral health specialist and after speaking with her about her son the behavioral health specialist concluded that he had OCD and needed to be on a different medication. Patient was concerned that he needed to be on something different than Wellbutrin.  Patient reports he has been ADHD and OCD his entire life. Patient did not answer the question straightforward whether or not he has been actually diagnosed by a health care practitioner. Patient denies any interference with daily activities. 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 He does not think he is OCD.  He is here today just to get advice on what to do next.  He reports feeling good, better able to deal with life stressors, and does not wish to stop Wellbutrin.   2)  Right ear "blocked up" feeling, decreased hearing   History Mc has a past medical history of Irritable bowel syndrome; GERD (gastroesophageal reflux disease); Anal lesion; Anal condyloma; and Anxiety.   He has past surgical history that includes Colonoscopy; Adenoid examination under anesthesia; and Cholecystectomy.   His family history is not on file.He reports that he has never smoked. He does not have any smokeless tobacco history on file. He reports that he does not drink alcohol or use illicit drugs.  Outpatient Prescriptions Prior to Visit  Medication Sig Dispense Refill  . buPROPion (WELLBUTRIN XL) 150 MG 24 hr tablet TAKE 1 TABLET BY MOUTH EVERY DAY 30 tablet 2   No facility-administered medications prior to visit.    ROS Review of Systems  Constitutional: Negative for fever, chills, diaphoresis and fatigue.  HENT: Positive for ear pain and hearing loss. Negative for  congestion, ear discharge, postnasal drip, rhinorrhea, sinus pressure, sneezing, sore throat, tinnitus and trouble swallowing.        Right ear  Eyes: Negative for visual disturbance.  Respiratory: Negative for chest tightness.   Psychiatric/Behavioral: Positive for decreased concentration. Negative for suicidal ideas, hallucinations, behavioral problems, confusion, sleep disturbance and agitation. The patient is nervous/anxious. The patient is not hyperactive.        Reports inattentiveness and anxiety    Objective:  BP 152/98 mmHg  Pulse 84  Temp(Src) 98.4 F (36.9 C) (Oral)  Resp 12  Ht  (1.753 m)  Wt 181 lb 8 oz (82.328 kg)  BMI 26.79 kg/m2  SpO2 96%  Physical Exam  Constitutional: He is oriented to person, place, and time. He appears well-developed and well-nourished. No distress.  HENT:  Head: Normocephalic and atraumatic.  Right Ear: External ear normal.  Left Ear: External ear normal.  TM on right not injected, bulging, or retracted. Fluid was seen behind ear with cerumen impacting the tube.   Cardiovascular: Normal rate, regular rhythm and normal heart sounds.  Exam reveals no gallop and no friction rub.   No murmur heard. Pulmonary/Chest: Effort normal and breath sounds normal. No respiratory distress. He has no wheezes. He has no rales. He exhibits no tenderness.  Neurological: He is alert and oriented to person, place, and time.  Skin: Skin is warm and dry. No rash noted. He is not diaphoretic.  Psychiatric: He has a normal mood and affect. His behavior is normal.  Judgment and thought content normal.  Patient is in a pleasant mood today and is able to keep up with conversation      Assessment & Plan:   Philip [name] was seen today for follow-up.  Diagnoses and all orders for this visit:  Adjustment reaction with anxiety and depression -     CBC with Differential/Platelet -     Comprehensive metabolic panel -     Hemoglobin A1c -     Lipid panel -      TSH  Elevated BP -     Comprehensive metabolic panel -     Lipid panel -     TSH  Need for Tdap vaccination -     Tdap vaccine greater than or equal to 7yo IM  Ear congestion, right  I am having Philip [name] maintain his buPROPion.  No orders of the defined types were placed in this encounter.     Follow-up: Return in about 3 months (around 06/06/2015) for Annual Physical Exam.

## 2015-03-11 ENCOUNTER — Other Ambulatory Visit: Payer: Self-pay | Admitting: Nurse Practitioner

## 2015-03-11 ENCOUNTER — Telehealth: Payer: Self-pay | Admitting: Nurse Practitioner

## 2015-03-11 DIAGNOSIS — R17 Unspecified jaundice: Secondary | ICD-10-CM

## 2015-03-11 NOTE — Telephone Encounter (Signed)
Attempted for a second time today to call this patient back, left a VM

## 2015-03-11 NOTE — Telephone Encounter (Signed)
Pt called to get lab results.. Please advise pt

## 2015-03-12 NOTE — Telephone Encounter (Signed)
Spoke with the patient.  Verbalized no repeat labs.

## 2015-03-12 NOTE — Telephone Encounter (Signed)
Spoke with the patient, reviewed his labs, he agreed that the bilirubin was high.  He stated that his bilirubin is always high since he was a child.  It actually will be even higher when he is sick.  He said he will get labs redrawn in a month if you would like, but it will remain high, please advise?

## 2015-03-12 NOTE — Telephone Encounter (Signed)
Well, now that I know that. I am okay with NOT getting repeat labs. Thanks for clarifying.

## 2015-03-18 ENCOUNTER — Ambulatory Visit: Payer: BLUE CROSS/BLUE SHIELD | Admitting: Nurse Practitioner

## 2015-06-01 ENCOUNTER — Other Ambulatory Visit: Payer: Self-pay | Admitting: Nurse Practitioner

## 2019-07-29 ENCOUNTER — Ambulatory Visit
Admission: EM | Admit: 2019-07-29 | Discharge: 2019-07-29 | Disposition: A | Discharge status: Home / Self Care | Payer: BC Managed Care – PPO | Attending: Emergency Medicine | Admitting: Emergency Medicine

## 2019-07-29 ENCOUNTER — Other Ambulatory Visit: Payer: Self-pay

## 2019-07-29 DIAGNOSIS — L0291 Cutaneous abscess, unspecified: Secondary | ICD-10-CM | POA: Diagnosis not present

## 2019-07-29 DIAGNOSIS — L0231 Cutaneous abscess of buttock: Secondary | ICD-10-CM | POA: Diagnosis not present

## 2019-07-29 DIAGNOSIS — L03317 Cellulitis of buttock: Secondary | ICD-10-CM

## 2019-07-29 MED ORDER — IBUPROFEN 600 MG PO TABS: 600.0000 mg | ORAL_TABLET | Freq: Four times a day (QID) | ORAL | 0 refills | Status: AC | PRN

## 2019-07-29 MED ORDER — DOXYCYCLINE HYCLATE 100 MG PO CAPS: 100.0000 mg | ORAL_CAPSULE | Freq: Two times a day (BID) | ORAL | 0 refills | Status: AC

## 2019-07-29 MED ORDER — CEFTRIAXONE SODIUM 1 G IJ SOLR
1.0000 g | Freq: Once | INTRAMUSCULAR | Status: AC
  Administered 2019-07-29: 19:00:00 1 g via INTRAMUSCULAR

## 2019-07-29 NOTE — Discharge Instructions (Signed)
Return here or follow up with your doctor in 3 days for a wound check if you are not getting significantly better.  Give [place] a working phone number so that we contact you if we need to change your antibiotics. Take the medication as written. Take 1 gram of tylenol with the motrin up to 3-4 times a day as needed for pain and fever. Return to the ER if you get worse, if the redness spreads significantly beyond the marks that I made today in clinic, have a persistent fever >100.4, or for any concerns.   Here is a list of primary care providers who are taking new patients:  Dr. Elizabeth Sauer, Dr. Schuyler Amor [address] [place] [address] [phone]  [facility] [address] [place] [address]  [phone]  [facility] [address] [place], [place] [address] [phone]  [facility] [address] [place]  [phone] [place], [place] [address]  Here are clinics/ other resources who will see you if you do not have insurance. Some have certain criteria that you must meet. Call them and find out what they are:  [facility]: [address] [place], [place] [address] Phone: [phone] Hours: First and Third Saturdays of each Month, 9 a.m. - 1 p.m.  [facility]: [address] [place], [place] [address] Phone: [phone] Hours: Tuesday, 4 p.m. - 8 p.m. Thursday, 1 p.m. - 8 p.m. Wednesday, 9 a.m. - [facility] [address], [place], [place] [address] Phone: [phone] Pharmacy Phone Number: [phone] Dental Phone Number: [phone] [facility] Insurance Help: [phone]  Dental Hours: Monday - Thursday, 8 a.m. - 6 p.m.  [facility] [address], [place], [place] [address] Phone: [phone] Pharmacy Phone Number: [phone] [facility] Insurance Help: [phone]  [facility] [address] [place] [place], [place]  [address] Phone: [phone] Pharmacy Phone Number: [phone] [facility] Insurance Help: [phone]  [facility] [address] [place], [place] [address] Phone: [phone] [facility] Insurance Help: [phone]   [facility] [address], [place], [place] [address] Phone: [phone]  Go to www.goodrx.com to look up your medications. This will give you a list of where you can find your prescriptions at the most affordable prices. Or ask the pharmacist what the cash price is, or if they have any other discount programs available to help make your medication more affordable. This can be less expensive than what you would pay with insurance.

## 2019-07-29 NOTE — ED Triage Notes (Signed)
Patient states that he has multiple boils on buttock. States that he has been very uncomfortable. States that he has redness and heat in the area. Patient states that he noticed the area on Saturday.

## 2019-07-29 NOTE — ED Provider Notes (Signed)
HPI  SUBJECTIVE:  Philip [name] is a 49 y.o. male who presents with multiple painful boils over his bilateral buttocks starting 4 days ago.  Reports erythema over both buttocks.  Reports dull constant achy pain described as soreness.  He states that these areas of swelling started off as pimples but have been gradually getting bigger.  He denies fevers, body aches.  He states that he got some purulent drainage from the "big one" last night.  No trauma, known insect bite.  No antipyretic in the past 4 to 6 hours.  He has tried Epson salt soaks, "mashing on it" and Tylenol 1000 mg.  The Tylenol helps.  Symptoms are worse with sitting, walking.  He has a past medical history of abscesses.  No known history of MRSA, diabetes, hypertension.  States that he has "whitecoat hypertension".  States that he keeps an eye on it at home.  PMD: None.   Past Medical History:  Diagnosis Date  . Anal condyloma   . Anal lesion   . Anxiety   . GERD (gastroesophageal reflux disease)   . Irritable bowel syndrome     Past Surgical History:  Procedure Laterality Date  . ADENOID EXAMINATION UNDER ANESTHESIA    . CHOLECYSTECTOMY    . COLONOSCOPY      History reviewed. No pertinent family history.  Social History   Tobacco Use  . Smoking status: Never Smoker  . Smokeless tobacco: Never Used  Substance Use Topics  . Alcohol use: No    Alcohol/week: 0.0 standard drinks  . Drug use: No     Current Facility-Administered Medications:  .  cefTRIAXone (ROCEPHIN) injection 1 g, 1 g, Intramuscular, Once, Domenick Gong, MD  Current Outpatient Medications:  .  buPROPion (WELLBUTRIN XL) 150 MG 24 hr tablet, TAKE 1 TABLET BY MOUTH EVERY DAY, Disp: 30 tablet, Rfl: 2 .  doxycycline (VIBRAMYCIN) 100 MG capsule, Take 1 capsule (100 mg total) by mouth 2 (two) times daily for 7 days., Disp: 14 capsule, Rfl: 0 .  ibuprofen (ADVIL) 600 MG tablet, Take 1 tablet (600 mg total) by mouth every 6 (six) hours as needed.,  Disp: 30 tablet, Rfl: 0  No Known Allergies   ROS  As noted in HPI.   Physical Exam  BP (!) 161/107 (BP Location: Right Arm)   Pulse (!) 106   Temp 99.2 F (37.3 C) (Oral)   Resp 18   Ht 5\' 9"  (1.753 m)   Wt 81.6 kg   SpO2 100%   BMI 26.58 kg/m   Constitutional: Well developed, well nourished, appears uncomfortable Eyes:  EOMI, conjunctiva normal bilaterally HENT: Normocephalic, atraumatic,mucus membranes moist Respiratory: Normal inspiratory effort Cardiovascular: Normal rate GI: nondistended skin: 14.5 x 17 tender area of erythema, increased temperature extending over both of his buttocks.  No subcutaneous crepitus. Right buttock: 7 x 3.5 cm area of induration with small amount of central fluctuance, with central pustule, 2 x 1.5 cm area of induration, no central fluctuance with central pustule  left buttock: 3 x 3 tender area of induration, no central fluctuance with central pustule   Marked area of erythema with a solid line.  Marked areas of induration with a dotted line for reference.      Musculoskeletal: no deformities Neurologic: Alert & oriented x 3, no focal neuro deficits Psychiatric: Speech and behavior appropriate   ED Course   Medications  cefTRIAXone (ROCEPHIN) injection 1 g (has no administration in time range)    Orders Placed  This Encounter  Procedures  . Aerobic Culture (superficial specimen)    Standing Status:   Standing    Number of Occurrences:   1  . Apply dressing    Standing Status:   Standing    Number of Occurrences:   1    No results found for this or any previous visit (from the past 24 hour(s)). No results found.  ED Clinical Impression  1. Abscess of buttock, right   2. Cellulitis of buttock   3. Abscess      ED Assessment/Plan  Outside records reviewed.  Patient has had several abscesses/cellulitis in the past all of which were treated with doxycycline.  Unable to find any record of wound cultures.  Procedure  note: Largest area thought to be an abscess cleaned with chlorhexidine x2.  Injected 0.5 cc of 1% lidocaine with epinephrine via local infiltration with adequate anesthesia.  Made a single incision with an 11 blade.  Expressed a small amount of pus.  Explored wound to break up loculations.  Wound was not packed as there was no significant cavity to pack.  Wound cultures obtained.  Dressing placed.  Patient tolerated procedure well.  1 g of Rocephin IM here due to extensive cellulitis.  Did not attempt to I&D the other 2 areas, as they had no central fluctuance.  Will have patient continue warm sitz baths, sending home with 7 days of doxycycline 100 milligrams p.o. twice daily, 600 mg ibuprofen combined with a thousand milligrams of Tylenol 3-4 times a day as needed for pain.  Return here in 3 days if not getting any better, to the ER if he gets worse or for any systemic symptoms.  Providing primary care list for ongoing care.  Discussed labs, MDM, treatment plan, and plan for follow-up with patient. Discussed sn/sx that should prompt return to the ED. patient agrees with plan.   Meds ordered this encounter  Medications  . cefTRIAXone (ROCEPHIN) injection 1 g  . doxycycline (VIBRAMYCIN) 100 MG capsule    Sig: Take 1 capsule (100 mg total) by mouth 2 (two) times daily for 7 days.    Dispense:  14 capsule    Refill:  0  . ibuprofen (ADVIL) 600 MG tablet    Sig: Take 1 tablet (600 mg total) by mouth every 6 (six) hours as needed.    Dispense:  30 tablet    Refill:  0    *This clinic note was created using [employer]. Therefore, there may be occasional mistakes despite careful proofreading.   ?    Melynda Ripple, MD 07/29/19 [phone]

## 2019-08-01 LAB — AEROBIC CULTURE W GRAM STAIN (SUPERFICIAL SPECIMEN)

## 2019-08-01 LAB — AEROBIC CULTURE? (SUPERFICIAL SPECIMEN)

## 2019-08-07 ENCOUNTER — Encounter: Payer: Self-pay | Admitting: Emergency Medicine

## 2019-08-07 ENCOUNTER — Emergency Department
Admission: EM | Admit: 2019-08-07 | Discharge: 2019-08-07 | Disposition: A | Discharge status: Home / Self Care | Payer: Worker's Compensation | Attending: Student | Admitting: Student

## 2019-08-07 ENCOUNTER — Emergency Department: Payer: Worker's Compensation

## 2019-08-07 ENCOUNTER — Other Ambulatory Visit: Payer: Self-pay

## 2019-08-07 DIAGNOSIS — Z23 Encounter for immunization: Secondary | ICD-10-CM | POA: Diagnosis not present

## 2019-08-07 DIAGNOSIS — Y9389 Activity, other specified: Secondary | ICD-10-CM | POA: Diagnosis not present

## 2019-08-07 DIAGNOSIS — W3182XA Contact with other commercial machinery, initial encounter: Secondary | ICD-10-CM | POA: Diagnosis not present

## 2019-08-07 DIAGNOSIS — Y929 Unspecified place or not applicable: Secondary | ICD-10-CM | POA: Insufficient documentation

## 2019-08-07 DIAGNOSIS — S61316A Laceration without foreign body of right little finger with damage to nail, initial encounter: Secondary | ICD-10-CM | POA: Insufficient documentation

## 2019-08-07 DIAGNOSIS — S61309A Unspecified open wound of unspecified finger with damage to nail, initial encounter: Secondary | ICD-10-CM

## 2019-08-07 DIAGNOSIS — Y990 Civilian activity done for income or pay: Secondary | ICD-10-CM | POA: Diagnosis not present

## 2019-08-07 MED ORDER — TETANUS-DIPHTH-ACELL PERTUSSIS 5-2.5-18.5 LF-MCG/0.5 IM SUSP
0.5000 mL | Freq: Once | INTRAMUSCULAR | Status: AC
  Administered 2019-08-07: 0.5 mL via INTRAMUSCULAR
  Filled 2019-08-07: qty 0.5

## 2019-08-07 MED ORDER — CEPHALEXIN 500 MG PO CAPS
500.0000 mg | ORAL_CAPSULE | Freq: Three times a day (TID) | ORAL | 0 refills | Status: DC
Start: 2019-08-07 — End: 2024-01-28

## 2019-08-07 MED ORDER — LIDOCAINE HCL (PF) 1 % IJ SOLN
5.0000 mL | Freq: Once | INTRAMUSCULAR | Status: AC
  Administered 2019-08-07: 5 mL via INTRADERMAL
  Filled 2019-08-07: qty 5

## 2019-08-07 NOTE — Discharge Instructions (Signed)
Follow up with [facility], call for an appointment Keep the area as dry as possible, wear the finger splint until seen by orthopedics, you may change the bandage in 24-48 hours

## 2019-08-07 NOTE — ED Triage Notes (Signed)
Laceration to right 5th finger  States he has some metal come across top of finger    Laceration noted to tip and nail

## 2019-08-07 NOTE — ED Provider Notes (Signed)
[facility] Emergency Department Provider Note  ____________________________________________   First MD Initiated Contact with Patient 08/07/19 1140     (approximate)  I have reviewed the triage vital signs and the nursing notes.   HISTORY  Chief Complaint Laceration    HPI Philip [name] is a 49 y.o. male presents emergency department complaining of a laceration of the right fifth finger.  Patient was at work when 2 large things like scissors came down on his finger.  Unsure of his last tetanus.  States he has had a lot of bleeding and pain distally.    Past Medical History:  Diagnosis Date  . Anal condyloma   . Anal lesion   . Anxiety   . GERD (gastroesophageal reflux disease)   . Irritable bowel syndrome     Patient Active Problem List   Diagnosis Date Noted  . Ear congestion 03/08/2015  . Elevated BP 03/08/2015  . Left eye complaint 12/23/2014  . Adjustment reaction with anxiety and depression 02/17/2014  . Hyposomnia, insomnia or sleeplessness associated with anxiety 02/17/2014  . 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 Irritable bowel syndrome     Past Surgical History:  Procedure Laterality Date  . ADENOID EXAMINATION UNDER ANESTHESIA    . CHOLECYSTECTOMY    . COLONOSCOPY      Prior to Admission medications   Medication Sig Start Date End Date Taking? Authorizing Provider  buPROPion (WELLBUTRIN XL) 150 MG 24 hr tablet TAKE 1 TABLET BY MOUTH EVERY DAY 06/01/15   Doss, Velora Heckler, RN  cephALEXin (KEFLEX) 500 MG capsule Take 1 capsule (500 mg total) by mouth 3 (three) times daily. 08/07/19   Adrien Dietzman, Linden Dolin, PA-C  ibuprofen (ADVIL) 600 MG tablet Take 1 tablet (600 mg total) by mouth every 6 (six) hours as needed. 07/29/19   Melynda Ripple, MD    Allergies Patient has no known allergies.  History reviewed. No pertinent family history.  Social History Social  History   Tobacco Use  . Smoking status: Never Smoker  . Smokeless tobacco: Never Used  Substance Use Topics  . Alcohol use: No    Alcohol/week: 0.0 standard drinks  . Drug use: No    Review of Systems  Constitutional: No fever/chills Eyes: No visual changes. ENT: No sore throat. Respiratory: Denies cough Cardiovascular: Denies chest pain Gastrointestinal: Denies abdominal pain Genitourinary: Negative for dysuria. Musculoskeletal: Negative for back pain.  Positive for right fifth finger pain Skin: Negative for rash. Psychiatric: no mood changes,     ____________________________________________   PHYSICAL EXAM:  VITAL SIGNS: ED Triage Vitals  Enc Vitals Group     BP 08/07/19 1134 (!) 160/101     Pulse Rate 08/07/19 1134 73     Resp 08/07/19 1134 18     Temp 08/07/19 1134 98.3 F (36.8 C)     Temp Source 08/07/19 1134 Oral     SpO2 08/07/19 1134 98 %     Weight 08/07/19 1138 180 lb (81.6 kg)     Height 08/07/19 1138 5\' 9"  (1.753 m)     Head Circumference --      Peak Flow --      Pain Score 08/07/19 1138 9     Pain Loc --      Pain Edu? --      Excl. in [place]? --     Constitutional: Alert and oriented. Well appearing and  in no acute distress. Eyes: Conjunctivae are normal.  Head: Atraumatic. Nose: No congestion/rhinnorhea. Mouth/Throat: Mucous membranes are moist.   Neck:  supple no lymphadenopathy noted Cardiovascular: Normal rate, regular rhythm. Respiratory: Normal respiratory effort.  No retractions GU: deferred Musculoskeletal: FROM all extremities, warm and well perfused, positive for partial nail avulsion, no other lacerations noted, a lot of bruising is noted underneath the nail abrasion noted proximal to the nailbed Neurologic:  Normal speech and language.  Skin:  Skin is warm, dry No rash noted. Psychiatric: Mood and affect are normal. Speech and behavior are normal.  ____________________________________________   LABS (all labs ordered are  listed, but only abnormal results are displayed)  Labs Reviewed - No data to display ____________________________________________   ____________________________________________  RADIOLOGY  X-ray of the right fifth finger is negative  ____________________________________________   PROCEDURES  Procedure(s) performed:   Marland KitchenMarland KitchenLaceration Repair  Date/Time: 08/07/2019 2:24 PM Performed by: Faythe Ghee, PA-C Authorized by: Faythe Ghee, PA-C   Consent:    Consent obtained:  Verbal   Consent given by:  Patient   Risks discussed:  Infection, pain, retained foreign body, tendon damage, need for additional repair, poor cosmetic result, poor wound healing, vascular damage and nerve damage Anesthesia (see MAR for exact dosages):    Anesthesia method:  Nerve block   Block anesthetic:  Lidocaine 1% w/o epi   Block injection procedure:  Anatomic landmarks identified, introduced needle, incremental injection, anatomic landmarks palpated and negative aspiration for blood   Block outcome:  Anesthesia achieved Laceration details:    Location:  Finger   Finger location:  R small finger Pre-procedure details:    Preparation:  Patient was prepped and draped in usual sterile fashion Exploration:    Hemostasis achieved with:  Direct pressure   Wound extent: no foreign bodies/material noted, no muscle damage noted and no underlying fracture noted   Treatment:    Area cleansed with:  Betadine and saline   Amount of cleaning:  Standard   Irrigation solution:  Sterile saline   Irrigation method:  Syringe and tap Skin repair:    Repair method:  Sutures   Suture size:  5-0   Suture material:  Nylon Approximation:    Approximation:  Loose Post-procedure details:    Dressing:  Non-adherent dressing and splint for protection Comments:     2 sutures were placed through the nail to tack the nailbed down.  Patient was not able to tolerate me removing the nail.  He is to follow-up with  orthopedics for further evaluation.      ____________________________________________   INITIAL IMPRESSION / ASSESSMENT AND PLAN / ED COURSE  Pertinent labs & imaging results that were available during my care of the patient were reviewed by me and considered in my medical decision making (see chart for details).   This 49 year old male presents emergency department with a right fifth finger injury which occurred at work earlier today.  Physical exam shows a partial nail avulsion with an abrasion prior to the nailbed.  Area is very tender to palpation.  X-ray of the right fifth finger, Tdap updated  Xylocaine for digital block was performed prior to x-ray  See procedure note for nail avulsion repair  Patient was given a prescription for Keflex 500 3 times daily due to the open wound underneath the nail.  He is to follow-up with orthopedics for wound recheck.  Return emergency department worsening.  No use of the right hand at work until released  by orthopedics.  He is to wear the splint until released by orthopedics.  Tdap was updated in the ED.    Philip [name] was evaluated in Emergency Department on 08/07/2019 for the symptoms described in the history of present illness. He was evaluated in the context of the global COVID-19 pandemic, which necessitated consideration that the patient might be at risk for infection with the SARS-CoV-2 virus that causes COVID-19. Institutional protocols and algorithms that pertain to the evaluation of patients at risk for COVID-19 are in a state of rapid change based on information released by regulatory bodies including the CDC and federal and state organizations. These policies and algorithms were followed during the patient's care in the ED.   As part of my medical decision making, I reviewed the following data within the electronic MEDICAL RECORD NUMBER Nursing notes reviewed and incorporated, Old chart reviewed, Radiograph reviewed , Notes from prior  ED visits and [place] Controlled Substance Database  ____________________________________________   FINAL CLINICAL IMPRESSION(S) / ED DIAGNOSES  Final diagnoses:  Fingernail avulsion, partial, initial encounter      NEW MEDICATIONS STARTED DURING THIS VISIT:  Discharge Medication List as of 08/07/2019  1:42 PM    START taking these medications   Details  cephALEXin (KEFLEX) 500 MG capsule Take 1 capsule (500 mg total) by mouth 3 (three) times daily., Starting Thu 08/07/2019, Normal         Note:  This document was prepared using Dragon voice recognition software and may include unintentional dictation errors.    Faythe Ghee, PA-C 08/07/19 1427    Miguel Aschoff., MD 08/07/19 [phone]

## 2022-01-12 IMAGING — DX DG FINGER LITTLE 2+V*R*
3 series · 3 of 3 positions shown · non-contrast
Comparison: None.

CLINICAL DATA: Laceration to the distal right small finger

EXAM:
RIGHT LITTLE FINGER 2+V

[finger ap]
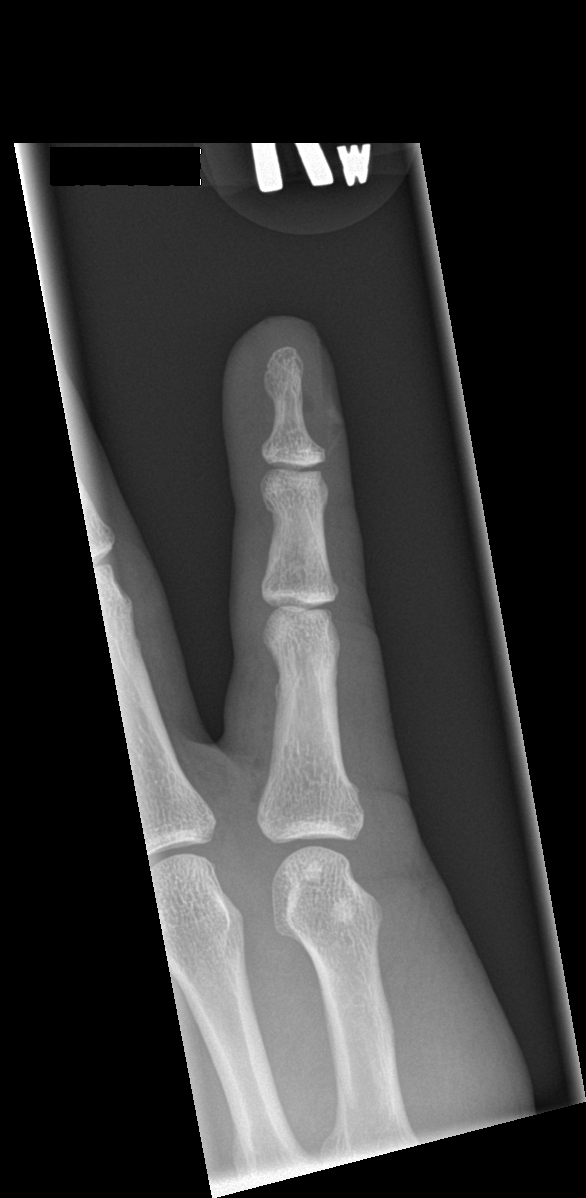

[finger obl]
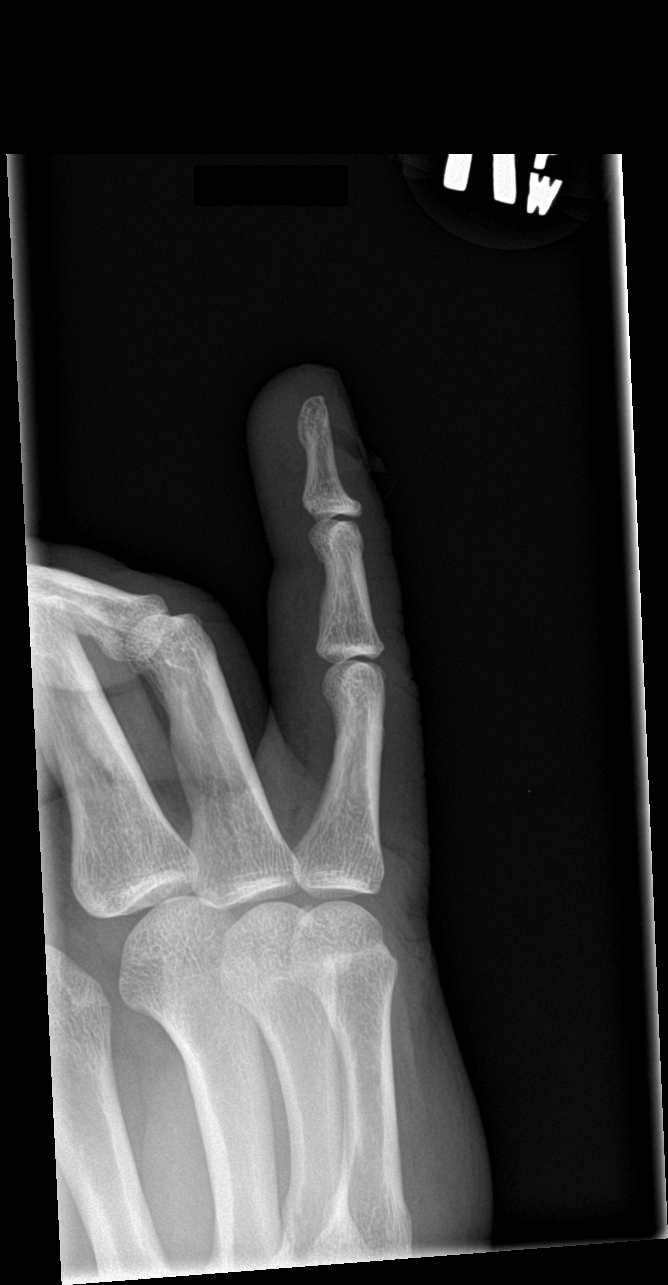

[finger lat]
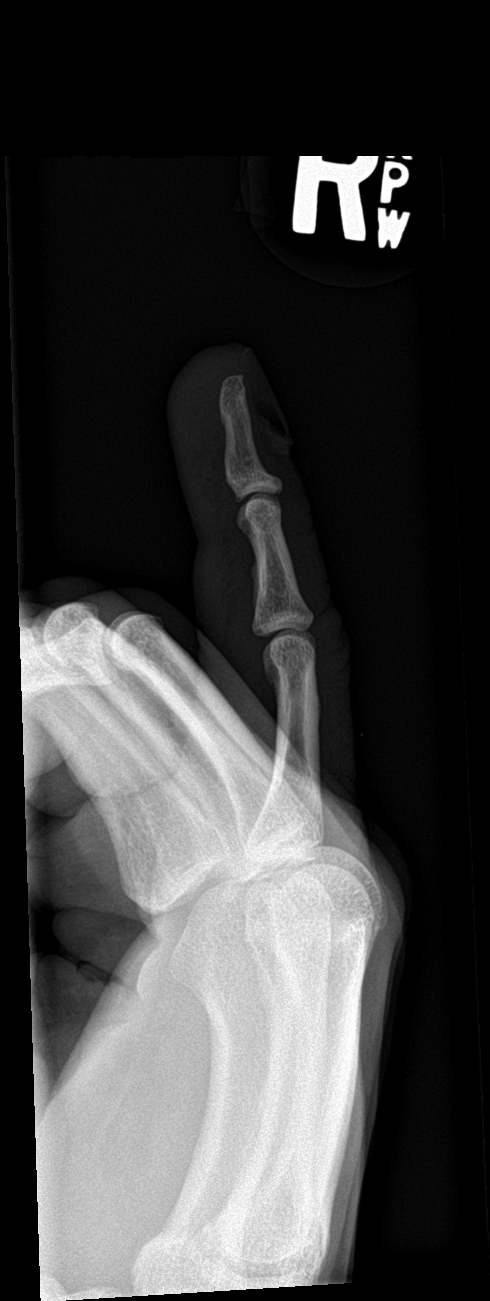

[3 of 3 positions shown; findings below may reference images not displayed]

FINDINGS: There is no evidence of fracture or dislocation. There is no
evidence of arthropathy or other focal bone abnormality. Soft tissue
irregularity with air in the region of the nail bed of the right
small finger. No radiopaque foreign body.
IMPRESSION: 1. No acute fracture or radiopaque foreign body.
2. Soft tissue laceration in the region of the right small finger
nailbed.

## 2024-01-14 ENCOUNTER — Other Ambulatory Visit: Payer: Self-pay | Admitting: Internal Medicine

## 2024-01-14 ENCOUNTER — Encounter: Payer: Self-pay | Admitting: Internal Medicine

## 2024-01-14 DIAGNOSIS — M545 Low back pain, unspecified: Secondary | ICD-10-CM

## 2024-01-14 DIAGNOSIS — R109 Unspecified abdominal pain: Secondary | ICD-10-CM

## 2024-01-16 ENCOUNTER — Ambulatory Visit
Admission: RE | Admit: 2024-01-16 | Discharge: 2024-01-16 | Disposition: A | Discharge status: Home / Self Care | Source: Ambulatory Visit | Attending: Internal Medicine | Admitting: Internal Medicine

## 2024-01-16 DIAGNOSIS — R109 Unspecified abdominal pain: Secondary | ICD-10-CM | POA: Insufficient documentation

## 2024-01-16 DIAGNOSIS — M545 Low back pain, unspecified: Secondary | ICD-10-CM | POA: Diagnosis present

## 2024-01-16 MED ORDER — IOHEXOL 350 MG/ML SOLN
100.0000 mL | Freq: Once | INTRAVENOUS | Status: AC | PRN
  Administered 2024-01-16: 100 mL via INTRAVENOUS

## 2024-01-25 ENCOUNTER — Ambulatory Visit (INDEPENDENT_AMBULATORY_CARE_PROVIDER_SITE_OTHER): Admitting: Vascular Surgery

## 2024-01-25 ENCOUNTER — Encounter (INDEPENDENT_AMBULATORY_CARE_PROVIDER_SITE_OTHER): Payer: Self-pay | Admitting: Vascular Surgery

## 2024-01-25 VITALS — BP 110/73 | HR 71 | Resp 18 | Wt 193.6 lb

## 2024-01-25 DIAGNOSIS — R161 Splenomegaly, not elsewhere classified: Secondary | ICD-10-CM

## 2024-01-25 DIAGNOSIS — I10 Essential (primary) hypertension: Secondary | ICD-10-CM | POA: Diagnosis not present

## 2024-01-25 DIAGNOSIS — I728 Aneurysm of other specified arteries: Secondary | ICD-10-CM

## 2024-01-25 NOTE — Assessment & Plan Note (Signed)
 Scheduled to see hematology next week.  Splenomegaly is not typically symptomatic, but it certainly is referable to the area he is complaining of pain without any other obvious source.  If hematology is concerned about a splenomegaly from a hematologic standpoint and this may be causing pain, embolization could be performed to address this which would also address the splenic artery aneurysm concomitantly.  The aneurysm itself is not large enough to require immediate repair otherwise.

## 2024-01-25 NOTE — Assessment & Plan Note (Addendum)
 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  This is below the threshold for prophylactic repair but will need to be monitored.  This would be very unlikely to be the cause of his pain.  I do question whether or not the splenomegaly may be part of the pain.  I will plan on a mesenteric duplex in 6 months to follow this.  Blood pressure control is extremely important for avoiding aneurysmal degeneration has his abstinence from tobacco.  If his splenomegaly is problematic or symptomatic, this can be addressed with splenic artery embolization which would also address the splenic artery aneurysm.

## 2024-01-25 NOTE — Progress Notes (Signed)
 Patient ID: Philip [name], male   DOB: 31-Jan-1971, 53 y.o.   MRN: [id number]  Chief Complaint  Patient presents with   New Patient (Initial Visit)    Ref Lenon consult aneurysm of splenic artery    HPI Philip [name] is a 53 y.o. male.  I am asked to see the patient by Dr. Lenon for evaluation of splenic artery aneurysm.  The patient has undergone a CT scan of the abdomen pelvis which I have independently reviewed. 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  This was not a thin cut CT or a CT angiogram so precise evaluation is somewhat difficult, but I think that is a reasonable estimate.  His biggest complaint recently has been of left flank pain.  He has had an upper and lower endoscopy that were largely unrevealing.  He also was found to have splenomegaly on this CT scan.  He is scheduled to see hematology early next week for the splenomegaly.   Past Medical History:  Diagnosis Date   Anal condyloma    Anal lesion    Anxiety    GERD (gastroesophageal reflux disease)    Irritable bowel syndrome     Past Surgical History:  Procedure Laterality Date   ADENOID EXAMINATION UNDER ANESTHESIA     CHOLECYSTECTOMY     COLONOSCOPY       No family history on file.    Social History   Tobacco Use   Smoking status: Never   Smokeless tobacco: Never  Vaping Use   Vaping status: Never Used  Substance Use Topics   Alcohol use: No    Alcohol/week: 0.0 standard drinks of alcohol   Drug use: No     No Known Allergies  Current Outpatient Medications  Medication Sig Dispense Refill   amLODipine (NORVASC) 5 MG tablet Take 5 mg by mouth daily.     aspirin EC 81 MG tablet Take 81 mg by mouth daily.     ibuprofen  (ADVIL ) 600 MG tablet Take 1 tablet (600 mg total) by mouth every 6 (six) hours as needed. 30 tablet 0   losartan-hydrochlorothiazide (HYZAAR) 100-12.5 MG tablet Take 1 tablet by mouth  daily.     pantoprazole (PROTONIX) 40 MG tablet Take 40 mg by mouth daily.     buPROPion  (WELLBUTRIN  XL) 150 MG 24 hr tablet TAKE 1 TABLET BY MOUTH EVERY DAY (Patient not taking: Reported on 01/25/2024) 30 tablet 2   cephALEXin  (KEFLEX ) 500 MG capsule Take 1 capsule (500 mg total) by mouth 3 (three) times daily. (Patient not taking: Reported on 01/25/2024) 21 capsule 0   No current facility-administered medications for this visit.      REVIEW OF SYSTEMS (Negative unless checked)  Constitutional: [] Weight loss  [] Fever  [] Chills Cardiac: [] Chest pain   [] Chest pressure   [] Palpitations   [] Shortness of breath when laying flat   [] Shortness of breath at rest   [] Shortness of breath with exertion. Vascular:  [] Pain in legs with walking   [] Pain in legs at rest   [] Pain in legs when laying flat   [] Claudication   [] Pain in feet when walking  [] Pain in feet at rest  [] Pain in feet when laying flat   [] History of DVT   [] Phlebitis   [] Swelling in legs   [] Varicose veins   [] Non-healing ulcers Pulmonary:   [] Uses home oxygen   [] Productive cough   [] Hemoptysis   []   Wheeze  [] COPD   [] Asthma Neurologic:  [] Dizziness  [] Blackouts   [] Seizures   [] History of stroke   [] History of TIA  [] Aphasia   [] Temporary blindness   [] Dysphagia   [] Weakness or numbness in arms   [] Weakness or numbness in legs Musculoskeletal:  [] Arthritis   [] Joint swelling   [] Joint pain   [] Low back pain Hematologic:  [] Easy bruising  [] Easy bleeding   [] Hypercoagulable state   [] Anemic  [] Hepatitis Gastrointestinal:  [] Blood in stool   [] Vomiting blood  [x] Gastroesophageal reflux/heartburn   [x] Abdominal pain Genitourinary:  [] Chronic kidney disease   [] Difficult urination  [] Frequent urination  [] Burning with urination   [] Hematuria Skin:  [] Rashes   [] Ulcers   [] Wounds Psychological:  [x] History of anxiety   []  History of major depression.    Physical Exam BP 110/73   Pulse 71   Resp 18   Wt 193 lb 9.6 oz (87.8 kg)   BMI  28.59 kg/m  Gen:  WD/WN, NAD Head: Philip [name]/AT, No temporalis wasting.  Ear/Nose/Throat: Hearing grossly intact, nares w/o erythema or drainage, oropharynx w/o Erythema/Exudate Eyes: Conjunctiva clear, sclera non-icteric  Neck: trachea midline.  No JVD.  Pulmonary:  Good air movement, respirations not labored, no use of accessory muscles  Cardiac: RRR, no JVD Vascular:  Vessel Right Left  Radial Palpable Palpable                                   Gastrointestinal:. No masses, surgical incisions, or scars. Musculoskeletal: M/S 5/5 throughout.  Extremities without ischemic changes.  No deformity or atrophy.  No edema. Neurologic: Sensation grossly intact in extremities.  Symmetrical.  Speech is fluent. Motor exam as listed above. Psychiatric: Judgment intact, Mood & affect appropriate for pt's clinical situation. Dermatologic: No rashes or ulcers noted.  No cellulitis or open wounds.    Radiology CT ABDOMEN PELVIS W WO CONTRAST Result Date: 01/16/2024 CLINICAL DATA:  Acute left-sided low back pain EXAM: CT ABDOMEN AND PELVIS WITHOUT AND WITH CONTRAST TECHNIQUE: Multidetector CT imaging of the abdomen and pelvis was performed following the standard protocol before and following the bolus administration of intravenous contrast. RADIATION DOSE REDUCTION: This exam was performed according to the departmental dose-optimization program which includes automated exposure control, adjustment of the mA and/or kV according to patient size and/or use of iterative reconstruction technique. CONTRAST:  100mL OMNIPAQUE IOHEXOL 350 MG/ML SOLN COMPARISON:  None Available. FINDINGS: Lower chest: No focal consolidation or pulmonary nodule in the lung bases. No pleural effusion or pneumothorax demonstrated. Partially imaged heart size is normal. Coronary artery calcification. Hepatobiliary: No focal hepatic lesions. No intra or extrahepatic biliary ductal dilation. Cholecystectomy. Pancreas: No focal lesions or  main ductal dilation. Spleen: Enlarged spleen measures 14.2 cm in AP dimension. Adrenals/Urinary Tract: No adrenal nodules. No suspicious renal mass, calculi or hydronephrosis. No focal bladder wall thickening. Stomach/Bowel: Normal appearance of the stomach. No evidence of bowel wall thickening, distention, or inflammatory changes. Normal appendix. Vascular/Lymphatic: Aortic atherosclerosis. Sequential peripherally calcified splenic artery aneurysms measure 1.5 x 1.4 cm and 1.0 x 1.0 cm (7:25). No enlarged abdominal or pelvic lymph nodes. Reproductive: Prostate is unremarkable. Other: No free fluid, fluid collection, or free air. Musculoskeletal: No acute or abnormal lytic or blastic osseous lesions. Multilevel degenerative changes of the partially imaged thoracic and lumbar spine. IMPRESSION: 1. No acute abdominopelvic findings. 2. Splenomegaly. 3. Sequential peripherally calcified splenic artery aneurysms measure 1.5 x 1.4  cm and 1.0 x 1.0 cm. 4. Aortic Atherosclerosis (ICD10-I70.0). Coronary artery calcifications. Assessment for potential risk factor modification, dietary therapy or pharmacologic therapy may be warranted, if clinically indicated. Electronically Signed   By: Limin  Xu M.D.   On: 01/16/2024 10:56    Labs No results found for this or any previous visit (from the past 2160 hours).  Assessment/Plan:  Splenic artery aneurysm Patient has an approximately 1.5 cm splenic artery aneurysm as well as a smaller more peripheral splenic artery aneurysm.  This is below the threshold for prophylactic repair but will need to be monitored.  This would be very unlikely to be the cause of his pain.  I do question whether or not the splenomegaly may be part of the pain.  I will plan on a mesenteric duplex in 6 months to follow this.  Blood pressure control is extremely important for avoiding aneurysmal degeneration has his abstinence from tobacco.  If his splenomegaly is problematic or symptomatic, this can  be addressed with splenic artery embolization which would also address the splenic artery aneurysm.  Essential hypertension blood pressure control important in reducing the progression of atherosclerotic disease and aneurysmal. On appropriate oral medications.   Splenomegaly Scheduled to see hematology next week.  Splenomegaly is not typically symptomatic, but it certainly is referable to the area he is complaining of pain without any other obvious source.  If hematology is concerned about a splenomegaly from a hematologic standpoint and this may be causing pain, embolization could be performed to address this which would also address the splenic artery aneurysm concomitantly.  The aneurysm itself is not large enough to require immediate repair otherwise.      Selinda Gu 01/25/2024, 9:21 AM   This note was created with Dragon medical transcription system.  Any errors from dictation are unintentional.

## 2024-01-25 NOTE — Assessment & Plan Note (Signed)
 blood pressure control important in reducing the progression of atherosclerotic disease and aneurysmal. On appropriate oral medications.

## 2024-01-28 ENCOUNTER — Ambulatory Visit
Admission: RE | Admit: 2024-01-28 | Discharge: 2024-01-28 | Disposition: A | Discharge status: Home / Self Care | Source: Ambulatory Visit | Attending: Internal Medicine | Admitting: Internal Medicine

## 2024-01-28 ENCOUNTER — Inpatient Hospital Stay

## 2024-01-28 ENCOUNTER — Inpatient Hospital Stay: Attending: Internal Medicine | Admitting: Internal Medicine

## 2024-01-28 ENCOUNTER — Encounter: Payer: Self-pay | Admitting: Internal Medicine

## 2024-01-28 VITALS — BP 158/91 | HR 92 | Temp 97.5°F | Resp 18 | Ht 69.0 in | Wt 194.8 lb

## 2024-01-28 DIAGNOSIS — R10A2 Flank pain, left side: Secondary | ICD-10-CM | POA: Diagnosis not present

## 2024-01-28 DIAGNOSIS — Z79899 Other long term (current) drug therapy: Secondary | ICD-10-CM | POA: Insufficient documentation

## 2024-01-28 DIAGNOSIS — R161 Splenomegaly, not elsewhere classified: Secondary | ICD-10-CM

## 2024-01-28 LAB — CBC WITH DIFFERENTIAL/PLATELET
Abs Immature Granulocytes: 0.02 K/uL (ref 0.00–0.07)
Basophils Absolute: 0 K/uL (ref 0.0–0.1)
Basophils Relative: 1 %
Eosinophils Absolute: 0.2 K/uL (ref 0.0–0.5)
Eosinophils Relative: 4 %
HCT: 40.3 % (ref 39.0–52.0)
Hemoglobin: 15 g/dL (ref 13.0–17.0)
Immature Granulocytes: 0 %
Lymphocytes Relative: 33 %
Lymphs Abs: 1.6 K/uL (ref 0.7–4.0)
MCH: 34.3 pg — ABNORMAL HIGH (ref 26.0–34.0)
MCHC: 37.2 g/dL — ABNORMAL HIGH (ref 30.0–36.0)
MCV: 92.2 fL (ref 80.0–100.0)
Monocytes Absolute: 0.5 K/uL (ref 0.1–1.0)
Monocytes Relative: 10 %
Neutro Abs: 2.4 K/uL (ref 1.7–7.7)
Neutrophils Relative %: 52 %
Platelets: 128 K/uL — ABNORMAL LOW (ref 150–400)
RBC: 4.37 MIL/uL (ref 4.22–5.81)
RDW: 12.4 % (ref 11.5–15.5)
Smear Review: NORMAL
WBC: 4.6 K/uL (ref 4.0–10.5)
nRBC: 0 % (ref 0.0–0.2)

## 2024-01-28 LAB — LACTATE DEHYDROGENASE: LDH: 107 U/L (ref 98–192)

## 2024-01-28 LAB — HEPATITIS B CORE ANTIBODY, IGM: Hep B C IgM: NONREACTIVE

## 2024-01-28 LAB — HEPATITIS B SURFACE ANTIGEN: Hepatitis B Surface Ag: NONREACTIVE

## 2024-01-28 LAB — COMPREHENSIVE METABOLIC PANEL WITH GFR
ALT: 26 U/L (ref 0–44)
AST: 18 U/L (ref 15–41)
Albumin: 4.6 g/dL (ref 3.5–5.0)
Alkaline Phosphatase: 51 U/L (ref 38–126)
Anion gap: 10 (ref 5–15)
BUN: 18 mg/dL (ref 6–20)
CO2: 24 mmol/L (ref 22–32)
Calcium: 9.3 mg/dL (ref 8.9–10.3)
Chloride: 102 mmol/L (ref 98–111)
Creatinine, Ser: 1.16 mg/dL (ref 0.61–1.24)
GFR, Estimated: >60 mL/min (ref >60)
Glucose, Bld: 115 mg/dL — ABNORMAL HIGH (ref 70–99)
Potassium: 3.7 mmol/L (ref 3.5–5.1)
Sodium: 136 mmol/L (ref 135–145)
Total Bilirubin: 2.1 mg/dL — ABNORMAL HIGH (ref 0.0–1.2)
Total Protein: 7.1 g/dL (ref 6.5–8.1)

## 2024-01-28 LAB — HEPATITIS C ANTIBODY: HCV Ab: NONREACTIVE

## 2024-01-28 NOTE — Progress Notes (Signed)
 [facility] CONSULT NOTE  Patient Care Team: Patient, No Pcp Per as PCP - General (General Practice) Gaylyn Gladis PENNER, MD (Inactive) as Consulting Physician (Gastroenterology) Claudene Logan Bolder, MD (Inactive) as Consulting Physician (General Surgery) Rennie Cindy SAUNDERS, MD as Consulting Physician (Oncology)  CHIEF COMPLAINTS/PURPOSE OF CONSULTATION: splenomegaly.   Oncology History   No history exists.    HISTORY OF PRESENTING ILLNESS: Patient ambulating- independently.   Alone   Philip [name] Philip [name] 53 y.o.  male pleasant patient referred to us  for further evaluation of splenomegaly.  Patient noted to have pain in the left lower flank region/posteriorly in the last 3 to 4 weeks.  Worsens with lying down and improves with movement.  There is a sleeps causing him to wake up at nighttime.  Notes to have relief lying to the right.  Pain is not constant but varies in intensity sometimes so uncomfortable that he has to get up and move around.  More recently will start on muscle relaxants which help him sleep but does not resolve the pain.  Patient was evaluated by PCP-CT scan showed mild splenomegaly and splenic aneurysm 1.5 cm in size.  Status post evaluation with vascular.  Patient interestingly admits to drenching nights sweats intermittently ongoing for the last 1 year.  Review of Systems  Constitutional:  Positive for diaphoresis. Negative for chills, fever, malaise/fatigue and weight loss.  HENT:  Negative for nosebleeds and sore throat.   Eyes:  Negative for double vision.  Respiratory:  Negative for cough, hemoptysis, sputum production, shortness of breath and wheezing.   Cardiovascular:  Negative for chest pain, palpitations, orthopnea and leg swelling.  Gastrointestinal:  Negative for abdominal pain, blood in stool, constipation, diarrhea, heartburn, melena, nausea and vomiting.  Genitourinary:  Negative for dysuria, frequency and urgency.  Musculoskeletal:   Positive for back pain. Negative for joint pain.  Skin: Negative.  Negative for itching and rash.  Neurological:  Negative for dizziness, tingling, focal weakness, weakness and headaches.  Endo/Heme/Allergies:  Does not bruise/bleed easily.  Psychiatric/Behavioral:  Negative for depression. The patient is not nervous/anxious and does not have insomnia.     MEDICAL HISTORY:  Past Medical History:  Diagnosis Date   Anal condyloma    Anal lesion    Anxiety    GERD (gastroesophageal reflux disease)    Irritable bowel syndrome     SURGICAL HISTORY: Past Surgical History:  Procedure Laterality Date   ADENOID EXAMINATION UNDER ANESTHESIA     CHOLECYSTECTOMY     COLONOSCOPY      SOCIAL HISTORY: Social History   Socioeconomic History   Marital status: Single    Spouse name: Not on file   Number of children: Not on file   Years of education: Not on file   Highest education level: Not on file  Occupational History   Not on file  Tobacco Use   Smoking status: Never   Smokeless tobacco: Never  Vaping Use   Vaping status: Never Used  Substance and Sexual Activity   Alcohol use: No    Alcohol/week: 0.0 standard drinks of alcohol   Drug use: No   Sexual activity: Not on file  Other Topics Concern   Not on file  Social History Narrative   Separated from wife   1 child- Boy 15   1 other Child- Girl 28   [employer] in [place] [employer] completed with extra [employer]   No pets   Social  Drivers of Health   Financial Resource Strain: Low Risk  (01/31/2023)   Received from [facility]   Overall Financial Resource Strain (CARDIA)    Difficulty of Paying Living Expenses: Not hard at all  Food Insecurity: No Food Insecurity (01/28/2024)   Hunger Vital Sign    Worried About Running Out of Food in the Last Year: Never true    Ran Out of Food in the Last Year: Never true  Transportation Needs: No Transportation Needs (01/28/2024)    PRAPARE - Administrator, Civil Service (Medical): No    Lack of Transportation (Non-Medical): No  Physical Activity: Not on file  Stress: Not on file  Social Connections: Not on file  Intimate Partner Violence: Not At Risk (01/28/2024)   Humiliation, Afraid, Rape, and Kick questionnaire    Fear of Current or Ex-Partner: No    Emotionally Abused: No    Physically Abused: No    Sexually Abused: No    FAMILY HISTORY: Family History  Problem Relation Age of Onset   Cancer Paternal Aunt     ALLERGIES:  has no known allergies.  MEDICATIONS:  Current Outpatient Medications  Medication Sig Dispense Refill   amLODipine (NORVASC) 5 MG tablet Take 5 mg by mouth daily.     aspirin EC 81 MG tablet Take 81 mg by mouth daily.     celecoxib (CELEBREX) 100 MG capsule Take 100 mg by mouth 2 (two) times daily as needed.     cyclobenzaprine (FLEXERIL) 10 MG tablet Take 10 mg by mouth 3 (three) times daily as needed.     ibuprofen  (ADVIL ) 600 MG tablet Take 1 tablet (600 mg total) by mouth every 6 (six) hours as needed. 30 tablet 0   losartan-hydrochlorothiazide (HYZAAR) 100-12.5 MG tablet Take 1 tablet by mouth daily.     pantoprazole (PROTONIX) 40 MG tablet Take 40 mg by mouth daily.     No current facility-administered medications for this visit.    PHYSICAL EXAMINATION:   Vitals:   01/28/24 1351  BP: (!) 158/91  Pulse: 92  Resp: 18  Temp: (!) 97.5 F (36.4 C)  SpO2: 97%   Filed Weights   01/28/24 1351  Weight: 194 lb 12.8 oz (88.4 kg)   Mild splenomegaly.   Physical Exam Vitals and nursing note reviewed.  HENT:     Head: Normocephalic and atraumatic.     Mouth/Throat:     Pharynx: Oropharynx is clear.  Eyes:     Extraocular Movements: Extraocular movements intact.     Pupils: Pupils are equal, round, and reactive to light.  Cardiovascular:     Rate and Rhythm: Normal rate and regular rhythm.  Pulmonary:     Comments: Decreased breath sounds bilaterally.   Abdominal:     Palpations: Abdomen is soft.  Musculoskeletal:        General: Normal range of motion.     Cervical back: Normal range of motion.  Skin:    General: Skin is warm.  Neurological:     General: No focal deficit present.     Mental Status: He is alert and oriented to person, place, and time.  Psychiatric:        Behavior: Behavior normal.        Judgment: Judgment normal.     LABORATORY DATA:  I have reviewed the data as listed Lab Results  Component Value Date   WBC 6.0 03/08/2015   HGB 16.2 03/08/2015   HCT 47.0  03/08/2015   MCV 99.9 03/08/2015   PLT 134.0 (L) 03/08/2015   No results for input(s): NA, K, CL, CO2, GLUCOSE, BUN, CREATININE, CALCIUM, GFRNONAA, GFRAA, PROT, ALBUMIN, AST, ALT, ALKPHOS, BILITOT, BILIDIR, IBILI in the last 8760 hours.  RADIOGRAPHIC STUDIES: I have personally reviewed the radiological images as listed and agreed with the findings in the report. CT ABDOMEN PELVIS W WO CONTRAST Result Date: 01/16/2024 CLINICAL DATA:  Acute left-sided low back pain EXAM: CT ABDOMEN AND PELVIS WITHOUT AND WITH CONTRAST TECHNIQUE: Multidetector CT imaging of the abdomen and pelvis was performed following the standard protocol before and following the bolus administration of intravenous contrast. RADIATION DOSE REDUCTION: This exam was performed according to the departmental dose-optimization program which includes automated exposure control, adjustment of the mA and/or kV according to patient size and/or use of iterative reconstruction technique. CONTRAST:  100mL OMNIPAQUE IOHEXOL 350 MG/ML SOLN COMPARISON:  None Available. FINDINGS: Lower chest: No focal consolidation or pulmonary nodule in the lung bases. No pleural effusion or pneumothorax demonstrated. Partially imaged heart size is normal. Coronary artery calcification. Hepatobiliary: No focal hepatic lesions. No intra or extrahepatic biliary ductal dilation. Cholecystectomy.  Pancreas: No focal lesions or main ductal dilation. Spleen: Enlarged spleen measures 14.2 cm in AP dimension. Adrenals/Urinary Tract: No adrenal nodules. No suspicious renal mass, calculi or hydronephrosis. No focal bladder wall thickening. Stomach/Bowel: Normal appearance of the stomach. No evidence of bowel wall thickening, distention, or inflammatory changes. Normal appendix. Vascular/Lymphatic: Aortic atherosclerosis. Sequential peripherally calcified splenic artery aneurysms measure 1.5 x 1.4 cm and 1.0 x 1.0 cm (7:25). No enlarged abdominal or pelvic lymph nodes. Reproductive: Prostate is unremarkable. Other: No free fluid, fluid collection, or free air. Musculoskeletal: No acute or abnormal lytic or blastic osseous lesions. Multilevel degenerative changes of the partially imaged thoracic and lumbar spine. IMPRESSION: 1. No acute abdominopelvic findings. 2. Splenomegaly. 3. Sequential peripherally calcified splenic artery aneurysms measure 1.5 x 1.4 cm and 1.0 x 1.0 cm. 4. Aortic Atherosclerosis (ICD10-I70.0). Coronary artery calcifications. Assessment for potential risk factor modification, dietary therapy or pharmacologic therapy may be warranted, if clinically indicated. Electronically Signed   By: Limin  Xu M.D.   On: 01/16/2024 10:56     Splenomegaly # Splenomegaly-14 cm left flank pain- OCT 2025- CT scan [PCP-Dr. Anderson] No acute abdominopelvic findings;  Sequential peripherally calcified splenic artery aneurysms measure 1.5 x 1.4 cm and 1.0 x 1.0 cm.  In the context of ongoing night sweats-I think is reasonable to consider workup for lymphoma-check CBC CMP LDH hepatitis panel-B and C.  Will also get a ultrasound of the spleen to quantify the volume.  # Left posterior flank pain-the etiology is unclear.  It is unclear if patient's mild splenomegaly is causing patient's significant pain.  # Splenic artery aneurysm calcified-status post evaluation with Dr. Marea.  Unlikely cause of patient  symptoms.  Once above workup is available we will discuss with Dr. Marea  Thank you Dr. Lenon MD for allowing me to participate in the care of your pleasant patient. Please do not hesitate to contact me with questions or concerns in the interim.    # DISPOSITION: # labs- cbc/cmp; Hepatitis panel; LDH;  # US  spleen ASAP-  # follow up TBD-Dr.B    Above plan of care was discussed with patient/family in detail.  My contact information was given to the patient/family.       Cindy JONELLE Joe, MD 01/28/2024 2:51 PM

## 2024-01-28 NOTE — Assessment & Plan Note (Addendum)
#   Splenomegaly-14 cm left flank pain- OCT 2025- CT scan [PCP-Dr. Anderson] No acute abdominopelvic findings;  Sequential peripherally calcified splenic artery aneurysms measure 1.5 x 1.4 cm and 1.0 x 1.0 cm.  In the context of ongoing night sweats-I think is reasonable to consider workup for lymphoma-check CBC CMP LDH hepatitis panel-B and C.  Will also get a ultrasound of the spleen to quantify the volume.  # Left posterior flank pain-the etiology is unclear.  It is unclear if patient's mild splenomegaly is causing patient's significant pain.  # Splenic artery aneurysm calcified-status post evaluation with Dr. Marea.  Unlikely cause of patient symptoms.  Once above workup is available we will discuss with Dr. Marea  Thank you Dr. Lenon MD for allowing me to participate in the care of your pleasant patient. Please do not hesitate to contact me with questions or concerns in the interim.    # DISPOSITION: # labs- cbc/cmp; Hepatitis panel; LDH;  # US  spleen ASAP-  # follow up TBD-Dr.B

## 2024-01-28 NOTE — Progress Notes (Signed)
 Pt states he is here today to see if he needs surgery of his spleen. He does have pain and feels like he can't relax, x3 weeks.

## 2024-02-08 ENCOUNTER — Other Ambulatory Visit: Payer: Self-pay | Admitting: Internal Medicine

## 2024-02-08 DIAGNOSIS — M549 Dorsalgia, unspecified: Secondary | ICD-10-CM

## 2024-02-11 ENCOUNTER — Other Ambulatory Visit: Payer: Self-pay | Admitting: Internal Medicine

## 2024-02-11 DIAGNOSIS — T1590XA Foreign body on external eye, part unspecified, unspecified eye, initial encounter: Secondary | ICD-10-CM

## 2024-02-11 DIAGNOSIS — M549 Dorsalgia, unspecified: Secondary | ICD-10-CM

## 2024-02-26 ENCOUNTER — Ambulatory Visit
Admission: RE | Admit: 2024-02-26 | Discharge: 2024-02-26 | Disposition: A | Source: Ambulatory Visit | Attending: Internal Medicine | Admitting: Internal Medicine

## 2024-02-26 DIAGNOSIS — T1590XA Foreign body on external eye, part unspecified, unspecified eye, initial encounter: Secondary | ICD-10-CM

## 2024-02-26 DIAGNOSIS — M549 Dorsalgia, unspecified: Secondary | ICD-10-CM

## 2024-03-03 ENCOUNTER — Ambulatory Visit: Payer: Self-pay | Admitting: Internal Medicine

## 2024-03-03 DIAGNOSIS — R161 Splenomegaly, not elsewhere classified: Secondary | ICD-10-CM

## 2024-03-03 NOTE — Telephone Encounter (Signed)
 Spoke to patient regarding results of the ultrasound-mild splenomegaly.  Patient's left flank pain is likely due to arthritis as noted on recent MRI.  Patient awaiting physiatry evaluation.   Recommend follow-up-please schedule-in 6 months-MD labs CBC CMP; ultrasound spleen prior- Dr.B

## 2024-03-04 ENCOUNTER — Other Ambulatory Visit: Payer: Self-pay | Admitting: *Deleted

## 2024-03-04 DIAGNOSIS — R161 Splenomegaly, not elsewhere classified: Secondary | ICD-10-CM

## 2024-07-22 ENCOUNTER — Encounter (INDEPENDENT_AMBULATORY_CARE_PROVIDER_SITE_OTHER)

## 2024-07-22 ENCOUNTER — Ambulatory Visit (INDEPENDENT_AMBULATORY_CARE_PROVIDER_SITE_OTHER): Admitting: Vascular Surgery

## 2024-09-02 ENCOUNTER — Inpatient Hospital Stay

## 2024-09-02 ENCOUNTER — Inpatient Hospital Stay: Admitting: Internal Medicine

## 2024-09-02 ENCOUNTER — Other Ambulatory Visit
